# Patient Record
Sex: Male | Born: 1955 | Race: White | Hispanic: No | Marital: Married | Smoking: Never smoker
Health system: Southern US, Community
[De-identification: ages and names within clinical notes are randomized; demographics above are authoritative.]

## PROBLEM LIST (undated history)

## (undated) DIAGNOSIS — F329 Major depressive disorder, single episode, unspecified: Secondary | ICD-10-CM

## (undated) DIAGNOSIS — R51 Headache: Secondary | ICD-10-CM

## (undated) DIAGNOSIS — G8929 Other chronic pain: Secondary | ICD-10-CM

## (undated) DIAGNOSIS — R519 Headache, unspecified: Secondary | ICD-10-CM

## (undated) DIAGNOSIS — G709 Myoneural disorder, unspecified: Secondary | ICD-10-CM

## (undated) DIAGNOSIS — M1990 Unspecified osteoarthritis, unspecified site: Secondary | ICD-10-CM

## (undated) DIAGNOSIS — I82409 Acute embolism and thrombosis of unspecified deep veins of unspecified lower extremity: Secondary | ICD-10-CM

## (undated) DIAGNOSIS — K219 Gastro-esophageal reflux disease without esophagitis: Secondary | ICD-10-CM

## (undated) DIAGNOSIS — F32A Depression, unspecified: Secondary | ICD-10-CM

## (undated) HISTORY — PX: SHOULDER SURGERY: SHX246

## (undated) HISTORY — DX: Acute embolism and thrombosis of unspecified deep veins of unspecified lower extremity: I82.409

## (undated) HISTORY — DX: Depression, unspecified: F32.A

## (undated) HISTORY — DX: Major depressive disorder, single episode, unspecified: F32.9

## (undated) HISTORY — PX: ABDOMINAL SURGERY: SHX537

---

## 1991-02-24 HISTORY — PX: OTHER SURGICAL HISTORY: SHX169

## 1991-02-24 HISTORY — PX: RESECTION DISTAL CLAVICAL: SHX5053

## 1994-02-23 HISTORY — PX: ROTATOR CUFF REPAIR: SHX139

## 2001-02-23 DIAGNOSIS — I82409 Acute embolism and thrombosis of unspecified deep veins of unspecified lower extremity: Secondary | ICD-10-CM

## 2001-02-23 HISTORY — DX: Acute embolism and thrombosis of unspecified deep veins of unspecified lower extremity: I82.409

## 2014-06-08 ENCOUNTER — Emergency Department (HOSPITAL_COMMUNITY)
Admission: EM | Admit: 2014-06-08 | Discharge: 2014-06-09 | Disposition: A | Discharge status: Home / Self Care | Payer: Federal, State, Local not specified - PPO | Attending: Emergency Medicine | Admitting: Emergency Medicine

## 2014-06-08 ENCOUNTER — Encounter (HOSPITAL_COMMUNITY): Payer: Self-pay | Admitting: *Deleted

## 2014-06-08 DIAGNOSIS — F419 Anxiety disorder, unspecified: Secondary | ICD-10-CM | POA: Diagnosis not present

## 2014-06-08 DIAGNOSIS — F329 Major depressive disorder, single episode, unspecified: Secondary | ICD-10-CM | POA: Diagnosis not present

## 2014-06-08 DIAGNOSIS — G8929 Other chronic pain: Secondary | ICD-10-CM | POA: Diagnosis not present

## 2014-06-08 DIAGNOSIS — F1310 Sedative, hypnotic or anxiolytic abuse, uncomplicated: Secondary | ICD-10-CM | POA: Diagnosis not present

## 2014-06-08 DIAGNOSIS — F32A Depression, unspecified: Secondary | ICD-10-CM

## 2014-06-08 DIAGNOSIS — R630 Anorexia: Secondary | ICD-10-CM | POA: Diagnosis not present

## 2014-06-08 HISTORY — DX: Other chronic pain: G89.29

## 2014-06-08 LAB — COMPREHENSIVE METABOLIC PANEL
ALT: 30 U/L (ref 0–53)
AST: 20 U/L (ref 0–37)
Albumin: 4.4 g/dL (ref 3.5–5.2)
Alkaline Phosphatase: 62 U/L (ref 39–117)
Anion gap: 11 (ref 5–15)
BUN: 14 mg/dL (ref 6–23)
CO2: 24 mmol/L (ref 19–32)
Calcium: 9.6 mg/dL (ref 8.4–10.5)
Chloride: 104 mmol/L (ref 96–112)
Creatinine, Ser: 0.9 mg/dL (ref 0.50–1.35)
GFR calc Af Amer: >90 mL/min (ref >90)
GFR calc non Af Amer: >90 mL/min (ref >90)
Glucose, Bld: 105 mg/dL — ABNORMAL HIGH (ref 70–99)
Potassium: 3.8 mmol/L (ref 3.5–5.1)
Sodium: 139 mmol/L (ref 135–145)
Total Bilirubin: 0.5 mg/dL (ref 0.3–1.2)
Total Protein: 7.1 g/dL (ref 6.0–8.3)

## 2014-06-08 LAB — URINALYSIS, ROUTINE W REFLEX MICROSCOPIC
Bilirubin Urine: NEGATIVE
Glucose, UA: NEGATIVE mg/dL
Hgb urine dipstick: NEGATIVE
Ketones, ur: NEGATIVE mg/dL
Leukocytes, UA: NEGATIVE
Nitrite: NEGATIVE
Protein, ur: NEGATIVE mg/dL
Specific Gravity, Urine: 1.016 (ref 1.005–1.030)
Urobilinogen, UA: 0.2 mg/dL (ref 0.0–1.0)
pH: 8 (ref 5.0–8.0)

## 2014-06-08 LAB — CBC WITH DIFFERENTIAL/PLATELET
Basophils Absolute: 0.1 10*3/uL (ref 0.0–0.1)
Basophils Relative: 1 % (ref 0–1)
Eosinophils Absolute: 0.1 10*3/uL (ref 0.0–0.7)
Eosinophils Relative: 1 % (ref 0–5)
HCT: 46.9 % (ref 39.0–52.0)
Hemoglobin: 16.2 g/dL (ref 13.0–17.0)
Lymphocytes Relative: 20 % (ref 12–46)
Lymphs Abs: 1.3 10*3/uL (ref 0.7–4.0)
MCH: 31 pg (ref 26.0–34.0)
MCHC: 34.5 g/dL (ref 30.0–36.0)
MCV: 89.8 fL (ref 78.0–100.0)
Monocytes Absolute: 0.5 10*3/uL (ref 0.1–1.0)
Monocytes Relative: 7 % (ref 3–12)
Neutro Abs: 4.6 10*3/uL (ref 1.7–7.7)
Neutrophils Relative %: 71 % (ref 43–77)
Platelets: 168 10*3/uL (ref 150–400)
RBC: 5.22 MIL/uL (ref 4.22–5.81)
RDW: 13 % (ref 11.5–15.5)
WBC: 6.4 10*3/uL (ref 4.0–10.5)

## 2014-06-08 LAB — RAPID URINE DRUG SCREEN, HOSP PERFORMED
Amphetamines: NOT DETECTED
Barbiturates: NOT DETECTED
Benzodiazepines: POSITIVE — AB
Cocaine: NOT DETECTED
Opiates: NOT DETECTED
Tetrahydrocannabinol: NOT DETECTED

## 2014-06-08 LAB — ETHANOL: Alcohol, Ethyl (B): <5 mg/dL (ref 0–9)

## 2014-06-08 MED ORDER — HYDROMORPHONE HCL 1 MG/ML IJ SOLN
1.0000 mg | Freq: Once | INTRAMUSCULAR | Status: AC
  Administered 2014-06-08: 1 mg via INTRAVENOUS
  Filled 2014-06-08: qty 1

## 2014-06-08 MED ORDER — ONDANSETRON HCL 4 MG/2ML IJ SOLN
4.0000 mg | Freq: Once | INTRAMUSCULAR | Status: AC
  Administered 2014-06-08: 4 mg via INTRAVENOUS
  Filled 2014-06-08: qty 2

## 2014-06-08 NOTE — ED Provider Notes (Signed)
CSN: [id number]     Arrival date & time 06/08/14  1730 History   First MD Initiated Contact with Patient 06/08/14 1814     Chief Complaint  Patient presents with  . Anxiety  . Pain     (Consider location/radiation/quality/duration/timing/severity/associated sxs/prior Treatment) Patient is a 59 y.o. male presenting with mental health disorder. The history is provided by the patient. No language interpreter was used.  Mental Health Problem Presenting symptoms: depression and disorganized thought process   Patient accompanied by:  Spouse Degree of incapacity (severity):  Severe Onset quality:  Gradual Duration:  6 months Timing:  Constant Progression:  Worsening Chronicity:  New Context: medication, recent medication change and stressful life event   Relieved by:  Nothing Worsened by:  Family interactions Ineffective treatments:  Anti-anxiety medications Associated symptoms: anxiety, appetite change and distractible   Associated symptoms: no abdominal pain   Risk factors: family hx of mental illness   Pt's wife reports she feels like pt has had some type of breakdown.  She reports pt has had 25 pounds of weight loss in the past 6 months.  Pt is not eating.  Pt is not drinking.  Pt admits to depression.  Pt denies being suicidal.  (Pt reports he does not have the energy) Wife reports pt has problems he doesn't want to talk about.   Pt is on a duragesic patch.  Past Medical History  Diagnosis Date  . Chronic pain    History reviewed. No pertinent past surgical history. History reviewed. No pertinent family history. History  Substance Use Topics  . Smoking status: Not on file  . Smokeless tobacco: Not on file  . Alcohol Use: No    Review of Systems  Constitutional: Positive for appetite change.  Gastrointestinal: Negative for abdominal pain.  Psychiatric/Behavioral: The patient is nervous/anxious.   All other systems reviewed and are negative.     Allergies  Review of  patient's allergies indicates no known allergies.  Home Medications   Prior to Admission medications   Not on File   BP 132/83 mmHg  Pulse 61  Temp(Src) 98 F (36.7 C) (Oral)  Resp 16  Ht 6\' 1"  (1.854 m)  Wt 189 lb (85.73 kg)  BMI 24.94 kg/m2  SpO2 97% Physical Exam  Constitutional: He is oriented to person, place, and time. He appears well-developed and well-nourished.  HENT:  Head: Normocephalic and atraumatic.  Eyes: Conjunctivae are normal. Pupils are equal, round, and reactive to light.  Neck: Normal range of motion. Neck supple.  Cardiovascular: Normal rate and normal heart sounds.   Pulmonary/Chest: Effort normal.  Abdominal: Soft.  Musculoskeletal: Normal range of motion.  Neurological: He is alert and oriented to person, place, and time. He has normal reflexes.  Poor concentration,   Skin: Skin is warm.  Psychiatric: He has a normal mood and affect.  Nursing note and vitals reviewed.   ED Course  Procedures (including critical care time) Labs Review Labs Reviewed  COMPREHENSIVE METABOLIC PANEL - Abnormal; Notable for the following:    Glucose, Bld 105 (*)    All other components within normal limits  URINE RAPID DRUG SCREEN (HOSP PERFORMED) - Abnormal; Notable for the following:    Benzodiazepines POSITIVE (*)    All other components within normal limits  CBC WITH DIFFERENTIAL/PLATELET  URINALYSIS, ROUTINE W REFLEX MICROSCOPIC  ETHANOL    Imaging Review No results found.   EKG Interpretation None      MDM  TTS evaluated pt.  [facility] counselor informed me pt meets criteria for inpatient depression treatment.  Behavioral health reports they have a bed available   Final diagnoses:  Depression  Chronic pain    Transfer to behavioral health    Elson Areas, PA-C 06/08/14 2217  Linwood Dibbles, MD 06/08/14 2222

## 2014-06-08 NOTE — ED Notes (Signed)
Called [facility] for transport. 

## 2014-06-08 NOTE — ED Notes (Signed)
TTS set up at bedside. 

## 2014-06-08 NOTE — ED Notes (Signed)
Pt and wife are discussing their preferences for pt's treatment, [facility].

## 2014-06-08 NOTE — ED Notes (Signed)
PA at bedside discussing disposition.

## 2014-06-08 NOTE — ED Notes (Signed)
Pt reports having chronic pain to entire body. Reports unable to eat or drink. Family states that "family has had a breakdown." pt denies SI or HI but is feeling depressed due to not being able to find "good doctors" in [place], he recently moved there.
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
## 2014-06-08 NOTE — BH Assessment (Signed)
Received TTS consult request. Spoke to Campbell LernerLeslie Sophia, PA-C who said Pt has a history of depression and has been not eating, not drinking, losing weight and severely depressed. Pt is unfocused with tangential thought process. Tele-assessment will be initiated.  Harlin RainFord Ellis Ria CommentWarrick Jr, LPC, [facility] Triage Specialist [phone]

## 2014-06-09 ENCOUNTER — Inpatient Hospital Stay (HOSPITAL_COMMUNITY)
Admission: AD | Admit: 2014-06-09 | Discharge: 2014-06-13 | DRG: 885 | Disposition: A | Discharge status: Home / Self Care | Payer: Federal, State, Local not specified - PPO | Source: Intra-hospital | Attending: Psychiatry | Admitting: Psychiatry

## 2014-06-09 ENCOUNTER — Encounter (HOSPITAL_COMMUNITY): Payer: Self-pay | Admitting: *Deleted

## 2014-06-09 DIAGNOSIS — G4700 Insomnia, unspecified: Secondary | ICD-10-CM | POA: Diagnosis present

## 2014-06-09 DIAGNOSIS — F332 Major depressive disorder, recurrent severe without psychotic features: Secondary | ICD-10-CM | POA: Diagnosis present

## 2014-06-09 DIAGNOSIS — M1990 Unspecified osteoarthritis, unspecified site: Secondary | ICD-10-CM | POA: Diagnosis present

## 2014-06-09 DIAGNOSIS — G8929 Other chronic pain: Secondary | ICD-10-CM | POA: Diagnosis present

## 2014-06-09 DIAGNOSIS — K219 Gastro-esophageal reflux disease without esophagitis: Secondary | ICD-10-CM | POA: Diagnosis present

## 2014-06-09 DIAGNOSIS — R519 Headache, unspecified: Secondary | ICD-10-CM

## 2014-06-09 DIAGNOSIS — R51 Headache: Secondary | ICD-10-CM

## 2014-06-09 HISTORY — DX: Headache, unspecified: R51.9

## 2014-06-09 HISTORY — DX: Myoneural disorder, unspecified: G70.9

## 2014-06-09 HISTORY — DX: Headache: R51

## 2014-06-09 HISTORY — DX: Unspecified osteoarthritis, unspecified site: M19.90

## 2014-06-09 HISTORY — DX: Gastro-esophageal reflux disease without esophagitis: K21.9

## 2014-06-09 LAB — VALPROIC ACID LEVEL: Valproic Acid Lvl: 51.8 ug/mL (ref 50.0–100.0)

## 2014-06-09 MED ORDER — HYDROXYZINE HCL 25 MG PO TABS: 25.0000 mg | ORAL_TABLET | ORAL | Status: DC | PRN

## 2014-06-09 MED ORDER — DIVALPROEX SODIUM 500 MG PO DR TAB
500.0000 mg | DELAYED_RELEASE_TABLET | Freq: Two times a day (BID) | ORAL | Status: DC
  Administered 2014-06-09 – 2014-06-13 (×9): 500 mg via ORAL
  Filled 2014-06-09: qty 1
  Filled 2014-06-09: qty 6
  Filled 2014-06-09 (×2): qty 1
  Filled 2014-06-09 (×2): qty 6
  Filled 2014-06-09 (×6): qty 1
  Filled 2014-06-09: qty 6
  Filled 2014-06-09: qty 1

## 2014-06-09 MED ORDER — CHOLECALCIFEROL 10 MCG (400 UNIT) PO TABS
400.0000 [IU] | ORAL_TABLET | Freq: Every day | ORAL | Status: DC
  Administered 2014-06-09 – 2014-06-13 (×5): 400 [IU] via ORAL
  Filled 2014-06-09 (×8): qty 1

## 2014-06-09 MED ORDER — TOPIRAMATE 100 MG PO TABS
100.0000 mg | ORAL_TABLET | Freq: Two times a day (BID) | ORAL | Status: DC
  Administered 2014-06-09: 100 mg via ORAL
  Filled 2014-06-09 (×6): qty 1

## 2014-06-09 MED ORDER — PANTOPRAZOLE SODIUM 40 MG PO TBEC
40.0000 mg | DELAYED_RELEASE_TABLET | Freq: Two times a day (BID) | ORAL | Status: DC
  Administered 2014-06-09 – 2014-06-13 (×8): 40 mg via ORAL
  Filled 2014-06-09 (×4): qty 1
  Filled 2014-06-09: qty 6
  Filled 2014-06-09 (×2): qty 1
  Filled 2014-06-09: qty 6
  Filled 2014-06-09: qty 1
  Filled 2014-06-09: qty 6
  Filled 2014-06-09: qty 1
  Filled 2014-06-09: qty 6
  Filled 2014-06-09 (×2): qty 1

## 2014-06-09 MED ORDER — DULOXETINE HCL 30 MG PO CPEP
30.0000 mg | ORAL_CAPSULE | Freq: Every day | ORAL | Status: DC
  Administered 2014-06-09 – 2014-06-13 (×5): 30 mg via ORAL
  Filled 2014-06-09: qty 1
  Filled 2014-06-09: qty 3
  Filled 2014-06-09 (×6): qty 1
  Filled 2014-06-09: qty 3

## 2014-06-09 MED ORDER — ALUM & MAG HYDROXIDE-SIMETH 200-200-20 MG/5ML PO SUSP
30.0000 mL | ORAL | Status: DC | PRN
  Administered 2014-06-10 – 2014-06-13 (×9): 30 mL via ORAL
  Filled 2014-06-09 (×9): qty 30

## 2014-06-09 MED ORDER — HYDROXYZINE HCL 25 MG PO TABS
25.0000 mg | ORAL_TABLET | ORAL | Status: DC | PRN
  Administered 2014-06-09 – 2014-06-12 (×3): 25 mg via ORAL
  Filled 2014-06-09: qty 10
  Filled 2014-06-09 (×3): qty 1

## 2014-06-09 MED ORDER — ENSURE ENLIVE PO LIQD
237.0000 mL | Freq: Two times a day (BID) | ORAL | Status: DC
  Administered 2014-06-10 – 2014-06-11 (×3): 237 mL via ORAL

## 2014-06-09 MED ORDER — METHOCARBAMOL 500 MG PO TABS
500.0000 mg | ORAL_TABLET | Freq: Three times a day (TID) | ORAL | Status: DC | PRN
  Administered 2014-06-09 – 2014-06-10 (×2): 500 mg via ORAL
  Filled 2014-06-09 (×3): qty 1

## 2014-06-09 MED ORDER — SUMATRIPTAN SUCCINATE 50 MG PO TABS
100.0000 mg | ORAL_TABLET | Freq: Four times a day (QID) | ORAL | Status: DC | PRN
  Administered 2014-06-12 – 2014-06-13 (×2): 100 mg via ORAL
  Filled 2014-06-09 (×3): qty 2

## 2014-06-09 MED ORDER — TIZANIDINE HCL 4 MG PO TABS
4.0000 mg | ORAL_TABLET | Freq: Two times a day (BID) | ORAL | Status: DC
  Administered 2014-06-09 – 2014-06-13 (×9): 4 mg via ORAL
  Filled 2014-06-09: qty 1
  Filled 2014-06-09 (×2): qty 6
  Filled 2014-06-09 (×6): qty 1
  Filled 2014-06-09: qty 2
  Filled 2014-06-09: qty 1
  Filled 2014-06-09 (×2): qty 6
  Filled 2014-06-09: qty 1

## 2014-06-09 MED ORDER — MAGNESIUM HYDROXIDE 400 MG/5ML PO SUSP: 30.0000 mL | Freq: Every day | ORAL | Status: DC | PRN

## 2014-06-09 MED ORDER — ACETAMINOPHEN 325 MG PO TABS: 650.0000 mg | ORAL_TABLET | Freq: Four times a day (QID) | ORAL | Status: DC | PRN

## 2014-06-09 MED ORDER — TRAZODONE HCL 50 MG PO TABS
50.0000 mg | ORAL_TABLET | Freq: Every evening | ORAL | Status: DC | PRN
  Administered 2014-06-09 – 2014-06-10 (×2): 50 mg via ORAL
  Filled 2014-06-09 (×2): qty 1

## 2014-06-09 MED ORDER — GABAPENTIN 100 MG PO CAPS
100.0000 mg | ORAL_CAPSULE | Freq: Three times a day (TID) | ORAL | Status: DC
  Administered 2014-06-09 – 2014-06-10 (×3): 100 mg via ORAL
  Filled 2014-06-09 (×10): qty 1

## 2014-06-09 NOTE — [facility] Suicide Risk Assessment (Signed)
[facility] Admission Suicide Risk Assessment   Nursing information obtained from:    Demographic factors:    Current Mental Status:    Loss Factors:    Historical Factors:    Risk Reduction Factors:    Total Time spent with patient: 1 hour Principal Problem: Major depressive disorder, recurrent severe without psychotic features Diagnosis:   Patient Active Problem List   Diagnosis Date Noted  . Major depressive disorder, recurrent severe without psychotic features [F33.2] 06/09/2014     Continued Clinical Symptoms:  Alcohol Use Disorder Identification Test Final Score (AUDIT): 0 The "Alcohol Use Disorders Identification Test", Guidelines for Use in Primary Care, Second Edition.  World Science writer [facility]). Score between 0-7:  no or low risk or alcohol related problems. Score between 8-15:  moderate risk of alcohol related problems. Score between 16-19:  high risk of alcohol related problems. Score 20 or above:  warrants further diagnostic evaluation for alcohol dependence and treatment.   CLINICAL FACTORS:   Depression:   Hopelessness Insomnia Recent sense of peace/wellbeing Severe Chronic Pain Previous Psychiatric Diagnoses and Treatments Medical Diagnoses and Treatments/Surgeries   Musculoskeletal: Strength & Muscle Tone: within normal limits Gait & Station: normal Patient leans: Backward  Psychiatric Specialty Exam: Physical Exam  ROS  Blood pressure 147/89, pulse 107, temperature 98 F (36.7 C), temperature source Oral, resp. rate 16, height  (1.854 m), weight 85.276 kg (188 lb).Body mass index is 24.81 kg/(m^2).  General Appearance: Casual  Eye Contact::  Fair  Speech:  Slow  Volume:  Decreased  Mood:  Anxious, Depressed and Dysphoric  Affect:  Constricted and Depressed  Thought Process:  Circumstantial  Orientation:  Full (Time, Place, and Person)  Thought Content:  Rumination  Suicidal Thoughts:  No  Homicidal Thoughts:  No  Memory:  Immediate;    Poor Recent;   Poor Remote;   Fair  Judgement:  Intact  Insight:  Shallow  Psychomotor Activity:  Decreased  Concentration:  Poor  Recall:  Poor  Fund of Knowledge:Fair  Language: Fair  Akathisia:  No  Handed:  Right  AIMS (if indicated):     Assets:  Communication Skills Desire for Improvement Housing  Sleep:     Cognition: Impaired,  Mild  ADL's:  Impaired     COGNITIVE FEATURES THAT CONTRIBUTE TO RISK:  Polarized thinking and Thought constriction (tunnel vision)    SUICIDE RISK:   Mild:  Suicidal ideation of limited frequency, intensity, duration, and specificity.  There are no identifiable plans, no associated intent, mild dysphoria and related symptoms, good self-control (both objective and subjective assessment), few other risk factors, and identifiable protective factors, including available and accessible social support.  PLAN OF CARE: Patient is 59 year old Caucasian married man who is admitted because of severe depression with feelings of hopelessness worthlessness and anhedonia.  He is feeling overwhelmed and he is very concerned about his health issues.  Patient has multiple health issues.  He reported cannot function and having suicidal thoughts but no plan.  Patient requires inpatient psychiatric treatment and stabilization.  Please see history, physical and treatment plan for more details.    Medical Decision Making:  Review of Psycho-Social Stressors (1), Review or order clinical lab tests (1), Decision to obtain old records (1), Review and summation of old records (2), Established Problem, Worsening (2), New Problem, with no additional work-up planned (3), Review of Medication Regimen & Side Effects (2) and Review of New Medication or Change in Dosage (2)  I certify that  inpatient services furnished can reasonably be expected to improve the patient's condition.   Yaqueline Gutter T. 06/09/2014, 3:42 PM

## 2014-06-09 NOTE — [facility] Group Notes (Signed)
[facility] Group Notes: (Clinical Social Work)   06/09/2014      Type of Therapy:  Group Therapy   Participation Level:  Did Not Attend despite MHT prompting   Ambrose MantleMareida Grossman-Orr, LCSW 06/09/2014, 12:54 PM
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
## 2014-06-09 NOTE — Progress Notes (Signed)
D: Pt denies SI/HI/AV. Pt is pleasant and cooperative. Pt rates depression at a 8, anxiety at a 10, and Helplessness/hopelessness at a 8. Pt would like to go home and feel she only is anxious and depressed because he is in so much pain and was taken of his pain medication.  A: Pt was offered support and encouragement. Pt was given scheduled medications. Pt was encourage to attend groups. Q 15 minute checks were done for safety.  R:Pt does not attend groups but interacts well with peers and staff. Pt taking medication.Pt receptive to treatment and safety maintained on unit.

## 2014-06-09 NOTE — ED Notes (Signed)
[facility] [place] in [facility] bag, all valuables went home with pt wife.

## 2014-06-09 NOTE — [facility] Group Notes (Signed)
The focus of this group is to educate the patient on the purpose and policies of crisis stabilization and provide a format to answer questions about their admission.  The group details unit policies and expectations of patients while admitted.  Pt did not attend 0900 nursing group this morning.  

## 2014-06-09 NOTE — Tx Team (Signed)
Initial Interdisciplinary Treatment Plan   PATIENT STRESSORS: Health problems Medication change or noncompliance   PATIENT STRENGTHS: Ability for insight Average or above average intelligence Capable of independent living Communication skills General fund of knowledge Supportive family/friends   PROBLEM LIST: Problem List/Patient Goals Date to be addressed Date deferred Reason deferred Estimated date of resolution  "Id like to be able to sleep" 06/09/14     "I just don't wanna be in pain" 06/09/14           Depression 06/09/14     Increased risk for suicide 06/09/14                              DISCHARGE CRITERIA:  Ability to meet basic life and health needs Adequate post-discharge living arrangements Improved stabilization in mood, thinking, and/or behavior Medical problems require only outpatient monitoring Motivation to continue treatment in a less acute level of care Need for constant or close observation no longer present Safe-care adequate arrangements made Verbal commitment to aftercare and medication compliance  PRELIMINARY DISCHARGE PLAN: Attend aftercare/continuing care group Outpatient therapy Participate in family therapy Return to previous living arrangement  PATIENT/FAMIILY INVOLVEMENT: This treatment plan has been presented to and reviewed with the patient, Joe Jennings, and/or family member. The patient and family have been given the opportunity to ask questions and make suggestions.  Fransico MichaelBrooks, Kesha Hurrell Laverne 06/09/2014, 1:17 AM

## 2014-06-09 NOTE — Progress Notes (Signed)
Psychoeducational Group Note  Date:  06/09/2014 Time:  2357  Group Topic/Focus:  Wrap-Up Group:   The focus of this group is to help patients review their daily goal of treatment and discuss progress on daily workbooks.  Participation Level: Did Not Attend  Participation Quality:  Not Applicable  Affect:  Not Applicable  Cognitive:  Not Applicable  Insight:  Not Applicable  Engagement in Group: Not Applicable  Additional Comments:  The patient sat in group for a few minutes and then left the room.   Hazle CocaGOODMAN, Henrietta Cieslewicz S 06/09/2014, 11:57 PM

## 2014-06-10 DIAGNOSIS — F332 Major depressive disorder, recurrent severe without psychotic features: Principal | ICD-10-CM

## 2014-06-10 MED ORDER — GABAPENTIN 100 MG PO CAPS
200.0000 mg | ORAL_CAPSULE | Freq: Three times a day (TID) | ORAL | Status: DC
  Administered 2014-06-10 – 2014-06-11 (×3): 200 mg via ORAL
  Filled 2014-06-10 (×8): qty 2

## 2014-06-10 NOTE — [facility] Group Notes (Signed)
[facility] Group Notes: (Clinical Social Work)   06/10/2014      Type of Therapy:  Group Therapy   Participation Level:  Did Not Attend despite MHT prompting   Tarissa Kerin Grossman-Orr, LCSW 06/10/2014, 12:33 PM     

## 2014-06-10 NOTE — Progress Notes (Signed)
D: Pt's mood is depressed. Eye contact is fair. Pt states that he doesn't want to be here and would like to be discharged soon. Denies SI/HI/AVH.  A: Support given. Verbalization encouraged. Pt encouraged to come to staff for any concerns. Medications given as prescribed. R: Pt is receptive. No complaints of pain or discomfort at this time. Q15 min safety checks maintained. Pt remains safe on the unit. Will continue to monitor.

## 2014-06-10 NOTE — [facility] Group Notes (Signed)
[facility] Group Notes:  (Nursing/MHT/Case Management/Adjunct)  Date:  06/10/2014  Time:  0930  Type of Therapy:  Nurse Education  Participation Level:  Active  Participation Quality:  Appropriate  Affect:  Appropriate  Cognitive:  Appropriate  Insight:  Appropriate  Engagement in Group:  Engaged  Modes of Intervention:  Education  Summary of Progress/Problems:  Joe Jennings, Joe Jennings L 06/10/2014, 4:17 PM
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
## 2014-06-10 NOTE — Progress Notes (Signed)
[facility] MD Progress Note  06/10/2014 1:35 PM Joe Jennings  MRN:  [id number] Subjective:  "Just not sure why I'm here."   Objective:  Joe Jennings is an 41 y. has a history of depression and describes how he has decompensated since his family moved from [place] to [place], [place] six months ago. Pt states he feels he is "having a mental breakdown." Pt reports he has been unable to find a primary care physician or a psychiatrist.   Today, he was seen.  He expressed the desire to establish mental health care now that he lives in [place].  He was unsure of the groups or the setting is most appropriate for him.  He does realize he needs help as an outpatient basis.  He is very motivated.    Principal Problem: Major depressive disorder, recurrent severe without psychotic features Diagnosis:   Patient Active Problem List   Diagnosis Date Noted  . Major depressive disorder, recurrent severe without psychotic features [F33.2] 06/09/2014   Total Time spent with patient: 30 minutes   Past Medical History:  Past Medical History  Diagnosis Date  . Chronic pain   . GERD (gastroesophageal reflux disease)   . Headache   . Neuromuscular disorder   . Arthritis     Past Surgical History  Procedure Laterality Date  . Resection distal clavical  1993  . Acromial plasty Left 1993  . Rotator cuff repair Right 1996   Family History: History reviewed. No pertinent family history. Social History:  History  Alcohol Use No     History  Drug Use No    History   Social History  . Marital Status: Married    Spouse Name: N/A  . Number of Children: N/A  . Years of Education: N/A   Social History Main Topics  . Smoking status: Never Smoker   . Smokeless tobacco: Not on file  . Alcohol Use: No  . Drug Use: No  . Sexual Activity: Yes   Other Topics Concern  . None   Social History Narrative   Additional History:    Sleep: Fair  Appetite:  Fair   Assessment:   Musculoskeletal: Strength  & Muscle Tone: within normal limits Gait & Station: normal Patient leans: N/A   Psychiatric Specialty Exam: Physical Exam  Vitals reviewed. Psychiatric: His mood appears anxious.    Review of Systems  Psychiatric/Behavioral: Positive for depression. The patient is nervous/anxious.   All other systems reviewed and are negative.   Blood pressure 158/100, pulse 105, temperature 98.4 F (36.9 C), temperature source Oral, resp. rate 16, height 6' 1"  (1.854 m), weight 85.276 kg (188 lb).Body mass index is 24.81 kg/(m^2).   General Appearance: Casual  Eye Contact:: Fair  Speech: Slow  Volume: Decreased  Mood: Anxious, Depressed and Dysphoric  Affect: Constricted and Depressed  Thought Process: Circumstantial  Orientation: Full (Time, Place, and Person)  Thought Content: Rumination  Suicidal Thoughts: No  Homicidal Thoughts: No  Memory: Immediate; Poor Recent; Poor Remote; Fair  Judgement: Intact  Insight: Shallow  Psychomotor Activity: Decreased  Concentration: Poor  Recall: Poor  Fund of Knowledge:Fair  Language: Fair  Akathisia: No  Handed: Right  AIMS (if indicated):    Assets: Communication Skills Desire for Improvement Housing  Sleep:  4.75 hrs  Cognition: Impaired, Mild  ADL's: Impaired        Current Medications: Current Facility-Administered Medications  Medication Dose Route Frequency Provider Last Rate Last Dose  . acetaminophen (TYLENOL) tablet 650 mg  650 mg Oral Q6H PRN Harriet Butte, NP      . alum & mag hydroxide-simeth (MAALOX/MYLANTA) 200-200-20 MG/5ML suspension 30 mL  30 mL Oral Q4H PRN Harriet Butte, NP      . cholecalciferol (VITAMIN D) tablet 400 Units  400 Units Oral Daily Harriet Butte, NP   400 Units at 06/10/14 0811  . divalproex (DEPAKOTE) DR tablet 500 mg  500 mg Oral BID Harriet Butte, NP   500 mg at 06/10/14 0811  . DULoxetine (CYMBALTA) DR capsule 30 mg  30 mg Oral Daily  Kerrie Buffalo, NP   30 mg at 06/10/14 0811  . feeding supplement (ENSURE ENLIVE) (ENSURE ENLIVE) liquid 237 mL  237 mL Oral BID BM Myer Peer Cobos, MD   237 mL at 06/10/14 1159  . gabapentin (NEURONTIN) capsule 200 mg  200 mg Oral TID Kerrie Buffalo, NP      . hydrOXYzine (ATARAX/VISTARIL) tablet 25 mg  25 mg Oral Q4H PRN Harriet Butte, NP   25 mg at 06/09/14 2300  . magnesium hydroxide (MILK OF MAGNESIA) suspension 30 mL  30 mL Oral Daily PRN Harriet Butte, NP      . methocarbamol (ROBAXIN) tablet 500 mg  500 mg Oral Q8H PRN Kerrie Buffalo, NP   500 mg at 06/09/14 1634  . pantoprazole (PROTONIX) EC tablet 40 mg  40 mg Oral BID Kerrie Buffalo, NP   40 mg at 06/10/14 0811  . SUMAtriptan (IMITREX) tablet 100 mg  100 mg Oral Q6H PRN Harriet Butte, NP      . tiZANidine (ZANAFLEX) tablet 4 mg  4 mg Oral BID Harriet Butte, NP   4 mg at 06/10/14 0811  . traZODone (DESYREL) tablet 50 mg  50 mg Oral QHS PRN Harriet Butte, NP   50 mg at 06/09/14 2109    Lab Results:  Results for orders placed or performed during the hospital encounter of 06/08/14 (from the past 48 hour(s))  CBC with Differential/Platelet     Status: None   Collection Time: 06/08/14  6:45 PM  Result Value Ref Range   WBC 6.4 4.0 - 10.5 K/uL   RBC 5.22 4.22 - 5.81 MIL/uL   Hemoglobin 16.2 13.0 - 17.0 g/dL   HCT 46.9 39.0 - 52.0 %   MCV 89.8 78.0 - 100.0 fL   MCH 31.0 26.0 - 34.0 pg   MCHC 34.5 30.0 - 36.0 g/dL   RDW 13.0 11.5 - 15.5 %   Platelets 168 150 - 400 K/uL   Neutrophils Relative % 71 43 - 77 %   Neutro Abs 4.6 1.7 - 7.7 K/uL   Lymphocytes Relative 20 12 - 46 %   Lymphs Abs 1.3 0.7 - 4.0 K/uL   Monocytes Relative 7 3 - 12 %   Monocytes Absolute 0.5 0.1 - 1.0 K/uL   Eosinophils Relative 1 0 - 5 %   Eosinophils Absolute 0.1 0.0 - 0.7 K/uL   Basophils Relative 1 0 - 1 %   Basophils Absolute 0.1 0.0 - 0.1 K/uL  Comprehensive metabolic panel     Status: Abnormal   Collection Time: 06/08/14  6:45 PM  Result  Value Ref Range   Sodium 139 135 - 145 mmol/L   Potassium 3.8 3.5 - 5.1 mmol/L   Chloride 104 96 - 112 mmol/L   CO2 24 19 - 32 mmol/L   Glucose, Bld 105 (H) 70 - 99 mg/dL   BUN 14 6 -  23 mg/dL   Creatinine, Ser 0.90 0.50 - 1.35 mg/dL   Calcium 9.6 8.4 - 10.5 mg/dL   Total Protein 7.1 6.0 - 8.3 g/dL   Albumin 4.4 3.5 - 5.2 g/dL   AST 20 0 - 37 U/L   ALT 30 0 - 53 U/L   Alkaline Phosphatase 62 39 - 117 U/L   Total Bilirubin 0.5 0.3 - 1.2 mg/dL   GFR calc non Af Amer >90 >90 mL/min   GFR calc Af Amer >90 >90 mL/min    Comment: (NOTE) The eGFR has been calculated using the CKD EPI equation. This calculation has not been validated in all clinical situations. eGFR's persistently <90 mL/min signify possible Chronic Kidney Disease.    Anion gap 11 5 - 15  Ethanol     Status: None   Collection Time: 06/08/14  6:45 PM  Result Value Ref Range   Alcohol, Ethyl (B) <5 0 - 9 mg/dL    Comment:        LOWEST DETECTABLE LIMIT FOR SERUM ALCOHOL IS 11 mg/dL FOR MEDICAL PURPOSES ONLY   Urinalysis, Routine w reflex microscopic     Status: None   Collection Time: 06/08/14  7:05 PM  Result Value Ref Range   Color, Urine YELLOW YELLOW   APPearance CLEAR CLEAR   Specific Gravity, Urine 1.016 1.005 - 1.030   pH 8.0 5.0 - 8.0   Glucose, UA NEGATIVE NEGATIVE mg/dL   Hgb urine dipstick NEGATIVE NEGATIVE   Bilirubin Urine NEGATIVE NEGATIVE   Ketones, ur NEGATIVE NEGATIVE mg/dL   Protein, ur NEGATIVE NEGATIVE mg/dL   Urobilinogen, UA 0.2 0.0 - 1.0 mg/dL   Nitrite NEGATIVE NEGATIVE   Leukocytes, UA NEGATIVE NEGATIVE    Comment: MICROSCOPIC NOT DONE ON URINES WITH NEGATIVE PROTEIN, BLOOD, LEUKOCYTES, NITRITE, OR GLUCOSE <1000 mg/dL.  Drug screen panel, emergency     Status: Abnormal   Collection Time: 06/08/14  7:05 PM  Result Value Ref Range   Opiates NONE DETECTED NONE DETECTED   Cocaine NONE DETECTED NONE DETECTED   Benzodiazepines POSITIVE (A) NONE DETECTED   Amphetamines NONE DETECTED NONE  DETECTED   Tetrahydrocannabinol NONE DETECTED NONE DETECTED   Barbiturates NONE DETECTED NONE DETECTED    Comment:        DRUG SCREEN FOR MEDICAL PURPOSES ONLY.  IF CONFIRMATION IS NEEDED FOR ANY PURPOSE, NOTIFY LAB WITHIN 5 DAYS.        LOWEST DETECTABLE LIMITS FOR URINE DRUG SCREEN Drug Class       Cutoff (ng/mL) Amphetamine      1000 Barbiturate      200 Benzodiazepine   209 Tricyclics       470 Opiates          300 Cocaine          300 THC              50   Valproic acid level     Status: None   Collection Time: 06/08/14 11:00 PM  Result Value Ref Range   Valproic Acid Lvl 51.8 50.0 - 100.0 ug/mL    Physical Findings: AIMS: Facial and Oral Movements Muscles of Facial Expression: None, normal Lips and Perioral Area: None, normal Jaw: None, normal Tongue: None, normal,Extremity Movements Upper (arms, wrists, hands, fingers): None, normal Lower (legs, knees, ankles, toes): None, normal, Trunk Movements Neck, shoulders, hips: None, normal, Overall Severity Severity of abnormal movements (highest score from questions above): None, normal Incapacitation due to abnormal movements: None, normal  Patient's awareness of abnormal movements (rate only patient's report): No Awareness, Dental Status Current problems with teeth and/or dentures?: No Does patient usually wear dentures?: No  CIWA:  CIWA-Ar Total: 10 COWS:     Treatment Plan Summary: Admit for crisis management and mood stabilization. Medication management to re-stabilize current mood symptoms.  Increased Neurontin to 200 mg TID for pain sx.   Group counseling sessions for coping skills Medical consults as needed Review and reinstate any pertinent home medications for other health problems  Medical Decision Making:  Review of Psycho-Social Stressors (1), Discuss test with performing physician (1), Review and summation of old records (2) and Review of New Medication or Change in Dosage (2)  Sheila May [place]  AGNP-BC 06/10/2014, 1:35 PM  I agreed with the findings, treatment and disposition plan of this patient. Berniece Andreas, MD

## 2014-06-11 MED ORDER — FAMOTIDINE 20 MG PO TABS: 20.0000 mg | ORAL_TABLET | Freq: Two times a day (BID) | ORAL | Status: DC

## 2014-06-11 MED ORDER — SUCRALFATE 1 G PO TABS
1.0000 g | ORAL_TABLET | Freq: Three times a day (TID) | ORAL | Status: DC
  Administered 2014-06-11 – 2014-06-13 (×8): 1 g via ORAL
  Filled 2014-06-11: qty 12
  Filled 2014-06-11 (×2): qty 1
  Filled 2014-06-11: qty 12
  Filled 2014-06-11 (×3): qty 1
  Filled 2014-06-11 (×2): qty 12
  Filled 2014-06-11: qty 1
  Filled 2014-06-11 (×2): qty 12
  Filled 2014-06-11: qty 1
  Filled 2014-06-11: qty 12
  Filled 2014-06-11: qty 1
  Filled 2014-06-11: qty 12

## 2014-06-11 MED ORDER — TRAZODONE HCL 100 MG PO TABS
100.0000 mg | ORAL_TABLET | Freq: Every day | ORAL | Status: DC
  Administered 2014-06-11: 100 mg via ORAL
  Filled 2014-06-11: qty 1

## 2014-06-11 MED ORDER — ENSURE ENLIVE PO LIQD
237.0000 mL | Freq: Three times a day (TID) | ORAL | Status: DC
  Administered 2014-06-11 – 2014-06-13 (×5): 237 mL via ORAL

## 2014-06-11 MED ORDER — TOPIRAMATE 25 MG PO TABS
50.0000 mg | ORAL_TABLET | Freq: Two times a day (BID) | ORAL | Status: DC
  Administered 2014-06-11 – 2014-06-13 (×4): 50 mg via ORAL
  Filled 2014-06-11: qty 2
  Filled 2014-06-11: qty 6
  Filled 2014-06-11: qty 2
  Filled 2014-06-11 (×2): qty 6
  Filled 2014-06-11 (×2): qty 2
  Filled 2014-06-11: qty 6
  Filled 2014-06-11: qty 2

## 2014-06-11 MED ORDER — TRAZODONE HCL 100 MG PO TABS
ORAL_TABLET | ORAL | Status: AC
  Filled 2014-06-11: qty 1

## 2014-06-11 MED ORDER — TRAZODONE HCL 100 MG PO TABS
100.0000 mg | ORAL_TABLET | Freq: Once | ORAL | Status: AC
  Administered 2014-06-11: 100 mg via ORAL
  Filled 2014-06-11 (×2): qty 1

## 2014-06-11 NOTE — Progress Notes (Signed)
D: Patient denies SI/HI and A/V hallucinations; patient reports sleep is poor; reports appetite is poor; reports energy level is low ; rates depression as 4/10; rates anxiety as 4/10; [place]; patient is   A: Monitored q 15 minutes; patient encouraged to attend groups; patient educated about medications; patient given medications per physician orders; patient encouraged to express feelings and/or concerns  R: Patient has had complaints about pain and that he needs to go; patient refused the prn pain medications available; Clinical research associatewriter had spoken with patient and spouse several times and made them aware; patient will still like to go home; patient has been moved to a medical bed and given a room alone due to terrible migraines; patient also needs darkness; patient appetite had been poor and has been drinking the nutrition supplements and was able to eat a [place] sandwich this evening; patient's interaction with staff and peers is appropriate but patient is irritable and reports its due to the pain; patient was able to set goal to talk with staff 1:1 when having feelings of SI; patient is taking medications as prescribed and tolerating medications

## 2014-06-11 NOTE — Progress Notes (Signed)
Psychoeducational Group Note  Date:  06/11/2014 Time:  0022  Group Topic/Focus:  Wrap-Up Group:   The focus of this group is to help patients review their daily goal of treatment and discuss progress on daily workbooks.  Participation Level: Did Not Attend  Participation Quality:  Not Applicable  Affect:  Not Applicable  Cognitive:  Not Applicable  Insight:  Not Applicable  Engagement in Group: Not Applicable  Additional Comments:  The patient did not attend group this evening.   Shelbey Spindler S 06/11/2014, 12:22 AM

## 2014-06-11 NOTE — [facility] Group Notes (Signed)
[facility] LCSW Group Therapy  Feelings Around Diagnosis 1:15 - 2:30 PM  06/11/2014 2:17 PM  Type of Therapy:  Group Therapy  Participation Level:  Did Not Attend  [name], Taleia Sadowski Hairston 06/11/2014, 2:17 PM
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
## 2014-06-11 NOTE — Progress Notes (Signed)
Adult Psychoeducational Group Note  Date:  06/11/2014 Time:  9:04 PM  Group Topic/Focus:  Wrap-Up Group:   The focus of this group is to help patients review their daily goal of treatment and discuss progress on daily workbooks.  Participation Level:  Did Not Attend  Additional Comments:  Pt was invited to come to group and stated "Not me."  Caswell CorwinOwen, Calob Baskette C 06/11/2014, 9:04 PM
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
## 2014-06-11 NOTE — Progress Notes (Signed)
Recreation Therapy Notes  Date: 04.18.2016 Time: 9:30am Location: [address] Hall Group Room   Group Topic: Stress Management  Goal Area(s) Addresses:  Patient will actively participate in stress management techniques presented during session.   Behavioral Response: Did not attend.   Marykay Lexenise L Kalie Cabral, LRT/CTRS  Jearl KlinefelterBlanchfield, Raneshia Derick L 06/11/2014 3:27 PM

## 2014-06-12 MED ORDER — PANTOPRAZOLE SODIUM 40 MG PO TBEC: 40.0000 mg | DELAYED_RELEASE_TABLET | Freq: Two times a day (BID) | ORAL | Status: DC

## 2014-06-12 MED ORDER — DULOXETINE HCL 30 MG PO CPEP: 30.0000 mg | ORAL_CAPSULE | Freq: Every day | ORAL | Status: DC

## 2014-06-12 MED ORDER — SUCRALFATE 1 G PO TABS: 1.0000 g | ORAL_TABLET | Freq: Three times a day (TID) | ORAL | Status: DC

## 2014-06-12 MED ORDER — TRAZODONE HCL 150 MG PO TABS: 150.0000 mg | ORAL_TABLET | Freq: Every day | ORAL | Status: DC

## 2014-06-12 MED ORDER — TRAZODONE HCL 100 MG PO TABS: 100.0000 mg | ORAL_TABLET | Freq: Every day | ORAL | Status: DC

## 2014-06-12 MED ORDER — TIZANIDINE HCL 4 MG PO TABS: 4.0000 mg | ORAL_TABLET | Freq: Two times a day (BID) | ORAL | Status: DC

## 2014-06-12 MED ORDER — HYDROXYZINE HCL 25 MG PO TABS: 25.0000 mg | ORAL_TABLET | ORAL | Status: DC | PRN

## 2014-06-12 MED ORDER — SUMATRIPTAN SUCCINATE 100 MG PO TABS: 100.0000 mg | ORAL_TABLET | Freq: Four times a day (QID) | ORAL | Status: DC | PRN

## 2014-06-12 MED ORDER — TOPIRAMATE 50 MG PO TABS: 50.0000 mg | ORAL_TABLET | Freq: Two times a day (BID) | ORAL | Status: DC

## 2014-06-12 MED ORDER — VITAMIN D 400 UNITS PO TABS: 400.0000 [IU] | ORAL_TABLET | Freq: Every day | ORAL | Status: DC

## 2014-06-12 MED ORDER — FENTANYL 25 MCG/HR TD PT72
25.0000 ug | MEDICATED_PATCH | TRANSDERMAL | Status: DC
  Administered 2014-06-12: 25 ug via TRANSDERMAL
  Filled 2014-06-12: qty 1

## 2014-06-12 MED ORDER — TRAZODONE HCL 150 MG PO TABS
150.0000 mg | ORAL_TABLET | Freq: Every day | ORAL | Status: DC
  Administered 2014-06-12: 150 mg via ORAL
  Filled 2014-06-12 (×3): qty 3
  Filled 2014-06-12: qty 1

## 2014-06-12 MED ORDER — DIVALPROEX SODIUM 500 MG PO DR TAB: 500.0000 mg | DELAYED_RELEASE_TABLET | Freq: Two times a day (BID) | ORAL | Status: DC

## 2014-06-12 MED ORDER — MULTI-VITAMIN/MINERALS PO TABS: 1.0000 | ORAL_TABLET | Freq: Every day | ORAL | Status: DC

## 2014-06-12 NOTE — Progress Notes (Signed)
Patient ID: Joe Jennings, male   DOB: 07-Aug-1955, 59 y.o.   MRN: [id number] [facility] MD Progress Note  06/12/2014 10:58 AM Joe Jennings  MRN:  [id number] Subjective:    Patient remains somatically focused and reports chronic headaches, knee pain, and reflux symptoms. He also complains of significant insomnia. He denies side effects from medications . He complains of the bed being very uncomfortable, contributing to his insomnia and pain.  Objective: I have discussed case with treatment team and have met with patient. With patient's express consent and in his presence have also had phone converstation/family meeting with his wife Joe Jennings). Patient is wanting discharge today, but wife asked him to remain in hospital at least one more day, in order to continue approaching/working on insomnia, irritability, depression, and she reiterated her concern that he needs " more work up to know what is going on that is causing his pain". As noted, it has been confirmed that patient is on Fentanyl patch 25 mcg/72 hours, and that his last patch has been on for 6 days or so already. Patient is not presenting with any current significant withdrawal, but does report increased pain , discomfort, and some loose stools. Patient and wife agree that the most important issue for them is to establish PCP and Pain Clinic locally , as patient feels he has not been able to consolidate a good treatment team since he moved from [place] a few months ago. Tolerating medications wel. States he has been on Depakote for " mood", but is not endorsing history of mania, Cymbalta well tolerated thus far. Patient had been on Topamax 100 mgrs BID for migraine prophylaxis but states it was not working and wants it tapered . Trazodone only partially helpful for sleep. No disruptive behaviors on unit, tends to isolate. Of note, he has a recent Head MRI without contrast dated 05/03/14, reported as subcutaneous lesion on vertex with benign  characteristics, slight amount of periventricular white matter disease- non specific for age group, lobular mucoperiostial thickening in maxillary sinuses .  Principal Problem: Major depressive disorder, recurrent severe without psychotic features Diagnosis:   Patient Active Problem List   Diagnosis Date Noted  . Major depressive disorder, recurrent severe without psychotic features [F33.2] 06/09/2014   Total Time spent with patient: 30 minutes   Past Medical History:  Past Medical History  Diagnosis Date  . Chronic pain   . GERD (gastroesophageal reflux disease)   . Headache   . Neuromuscular disorder   . Arthritis     Past Surgical History  Procedure Laterality Date  . Resection distal clavical  1993  . Acromial plasty Left 1993  . Rotator cuff repair Right 1996   Family History: History reviewed. No pertinent family history. Social History:  History  Alcohol Use No     History  Drug Use No    History   Social History  . Marital Status: Married    Spouse Name: N/A  . Number of Children: N/A  . Years of Education: N/A   Social History Main Topics  . Smoking status: Never Smoker   . Smokeless tobacco: Not on file  . Alcohol Use: No  . Drug Use: No  . Sexual Activity: Yes   Other Topics Concern  . None   Social History Narrative   Additional History:    Sleep: Fair  Appetite:  Fair   Assessment:   Musculoskeletal: Strength & Muscle Tone: within normal limits Gait & Station: normal Patient leans: N/A  Psychiatric Specialty Exam: Physical Exam  Vitals reviewed. Psychiatric: His mood appears anxious.    Review of Systems  Constitutional: Negative for fever and chills.  Respiratory: Negative for cough and shortness of breath.   Cardiovascular: Negative for chest pain.  Gastrointestinal: Positive for heartburn.  Musculoskeletal: Negative.        Chronic knee pain   Skin: Negative for rash.  Neurological: Positive for headaches.   Psychiatric/Behavioral: Positive for depression. The patient has insomnia.     Blood pressure 126/85, pulse 106, temperature 98.3 F (36.8 C), temperature source Oral, resp. rate 18, height 6' 1"  (1.854 m), weight 188 lb (85.276 kg).Body mass index is 24.81 kg/(m^2).   General Appearance:  Fairly groomed   Engineer, water:: improved   Speech:normal   Mood:  Dysphoric   Affect:congruent, anxious   Thought Process:  Linear   Orientation: Full (Time, Place, and Person)  Thought Content:  Denies hallucinations, no delusions, focused on pain management issues   Suicidal Thoughts: No- at this time denies plan or intention of hurting self   Homicidal Thoughts: No  Memory: recent and remote grossly intact   Judgement: fair   Insight:  Fair   Psychomotor Activity: normal  Concentration: Poor  Recall: Poor  Fund of Knowledge:Fair  Language: Fair  Akathisia: No  Handed: Right  AIMS (if indicated):    Assets: Communication Skills Desire for Improvement Housing  Sleep:  4.75 hrs  Cognition: Impaired, Mild  ADL's: Impaired        Current Medications: Current Facility-Administered Medications  Medication Dose Route Frequency Provider Last Rate Last Dose  . acetaminophen (TYLENOL) tablet 650 mg  650 mg Oral Q6H PRN Harriet Butte, NP      . alum & mag hydroxide-simeth (MAALOX/MYLANTA) 200-200-20 MG/5ML suspension 30 mL  30 mL Oral Q4H PRN Harriet Butte, NP   30 mL at 06/11/14 2337  . cholecalciferol (VITAMIN D) tablet 400 Units  400 Units Oral Daily Harriet Butte, NP   400 Units at 06/12/14 0758  . divalproex (DEPAKOTE) DR tablet 500 mg  500 mg Oral BID Harriet Butte, NP   500 mg at 06/12/14 0759  . DULoxetine (CYMBALTA) DR capsule 30 mg  30 mg Oral Daily Kerrie Buffalo, NP   30 mg at 06/12/14 0759  . feeding supplement (ENSURE ENLIVE) (ENSURE ENLIVE) liquid 237 mL  237 mL Oral TID BM Clayton Bibles, RD   237 mL at 06/12/14 1005  . fentaNYL  (DURAGESIC - dosed mcg/hr) patch 25 mcg  25 mcg Transdermal Q72H Myer Peer Markese Bloxham, MD      . hydrOXYzine (ATARAX/VISTARIL) tablet 25 mg  25 mg Oral Q4H PRN Harriet Butte, NP   25 mg at 06/12/14 0241  . magnesium hydroxide (MILK OF MAGNESIA) suspension 30 mL  30 mL Oral Daily PRN Harriet Butte, NP      . methocarbamol (ROBAXIN) tablet 500 mg  500 mg Oral Q8H PRN Kerrie Buffalo, NP   500 mg at 06/10/14 2126  . pantoprazole (PROTONIX) EC tablet 40 mg  40 mg Oral BID Kerrie Buffalo, NP   40 mg at 06/12/14 0758  . sucralfate (CARAFATE) tablet 1 g  1 g Oral TID WC & HS Laverle Hobby, PA-C   1 g at 06/12/14 0759  . SUMAtriptan (IMITREX) tablet 100 mg  100 mg Oral Q6H PRN Harriet Butte, NP      . tiZANidine (ZANAFLEX) tablet 4 mg  4 mg Oral BID  Harriet Butte, NP   4 mg at 06/12/14 0759  . topiramate (TOPAMAX) tablet 50 mg  50 mg Oral BID Jenne Campus, MD   50 mg at 06/12/14 0759  . traZODone (DESYREL) tablet 150 mg  150 mg Oral QHS Jenne Campus, MD        Lab Results:  No results found for this or any previous visit (from the past 48 hour(s)).  Physical Findings: AIMS: Facial and Oral Movements Muscles of Facial Expression: None, normal Lips and Perioral Area: None, normal Jaw: None, normal Tongue: None, normal,Extremity Movements Upper (arms, wrists, hands, fingers): None, normal Lower (legs, knees, ankles, toes): None, normal, Trunk Movements Neck, shoulders, hips: None, normal, Overall Severity Severity of abnormal movements (highest score from questions above): None, normal Incapacitation due to abnormal movements: None, normal Patient's awareness of abnormal movements (rate only patient's report): No Awareness, Dental Status Current problems with teeth and/or dentures?: No Does patient usually wear dentures?: No  CIWA:  CIWA-Ar Total: 10 COWS:      Assessment: patient remains  Dysphoric , depressed, anxious, somatically focused, complains of insomnia. Not suicidal .  Patient initially requested discharge, and had put in a letter requesting discharge, but decided to rescind and stay in hospital at wife's encouragement.   Treatment Plan Summary: Continue inpatient treatment. Continue Cymbalta 30 mgrs QDAY  Continue Depakote ER 500 mgrs BID Continue Neurontin- 200 mgrs TID Restart Fentanyl Patch 25 mcgr Q 72 hours  Increase Trazodone to 150 mgrs QHS for insomnia Continue Protonix and Carafate for GI symptoms Will help patient establish contact with local pain clinic for ongoing management for ongoing management after discharge , and will refer to establish PCP follow up.  Medical Decision Making:  Established Problem, Stable/Improving (1), Review of Psycho-Social Stressors (1), Review or order clinical lab tests (1) and Review of Medication Regimen & Side Effects (2)  Bee Hammerschmidt  06/12/2014, 10:58 AM

## 2014-06-12 NOTE — Tx Team (Signed)
Interdisciplinary Treatment Plan Update   Date Reviewed:  06/12/2014  Time Reviewed:  3:04 PM  Progress in Treatment:   Attending groups: Yes, patient is attending groups. Participating in groups: Yes, engages in group discussion. Taking medication as prescribed: Yes  Tolerating medication: Yes Family/Significant other contact made:  Yes, collateral contact with wife. Patient understands diagnosis: Yes, patient understands diagnosis and need for treatment. Discussing patient identified problems/goals with staff: Yes, patient is able to express goals for treatment and discharge. Medical problems stabilized or resolved: Yes Denies suicidal/homicidal ideation: Yes Patient has not harmed self or others: Yes  For review of initial/current patient goals, please see plan of care.  Estimated Length of Stay:  Discharge today  Reasons for Continued Hospitalization:   New Problems/Goals identified:    Discharge Plan or Barriers:   [facility]  Additional Comments:  Patient and CSW reviewed patient's identified goals and treatment plan.  Patient verbalized understanding and agreed to treatment plan.   Attendees:  Patient:  06/12/2014 3:04 PM   Signature:  Sallyanne HaversF. Cobos, MD 06/12/2014 3:04 PM  Signature: Geoffery LyonsIrving Lugo, MD 06/12/2014 3:04 PM  Signature: Michaelle BirksBritney Guthrie,  RN 06/12/2014 3:04 PM  Signature:  Joslyn Devonaroline Beaudry, , RN 06/12/2014 3:04 PM  Signature:   06/12/2014 3:04 PM  Signature:  Juline PatchQuylle Juron Vorhees, LCSW 06/12/2014 3:04 PM  Signature:  Belenda CruiseKristin Drinkard, LCSW-A 06/12/2014 3:04 PM  Signature:  Leisa LenzValerie Enoch, Care Coordinator [facility] 06/12/2014 3:04 PM  Signature:   06/12/2014 3:04 PM  Signature:  06/12/2014  3:04 PM  Signature:   Onnie BoerJennifer Clark, RN URCM 06/12/2014  3:04 PM  Signature:   06/12/2014  3:04 PM    Scribe for Treatment Team:   Juline PatchQuylle Rachella Basden,  06/12/2014 3:04 PM

## 2014-06-12 NOTE — Progress Notes (Signed)
D   Pt complains of severe heartburn and pain  He continues to ask for his pain patches he said was supposed to be changed on Saturday   He said his wife brought them and he wants one  It was explained to pt that staff cannot give medication if doctor didn't order same and reassured him the doctor knows he is on the patch   He continues to grimace and complain A   Reassured pt of staff concern and offered other medications to help with his pain   Told pa of heartburn issue and new orders were received   Told pt of some known side effects of opiate pain medications regarding increasing depression    Administered medications and continue to monitor for effectiveness   Q 15 min checks   Encouraged pt to elevate his head and informed him of certain drinks and foods that worsen heartburn  R   Pt remains safe but was somewhat resistant to taking some of the medications ordered for pain  But did take his other medications   He verbalized understanding

## 2014-06-12 NOTE — Progress Notes (Signed)
D  Pt reports feeling much better now that he has his pain patch back on   He continues to have heartburn and takes maalox for same   He said the doctor plans to discharge him tomorrow and he said he is ready A   Verbal support given   Medications administered and effectiveness monitored   q 15 min checks R   Pt is safe at present

## 2014-06-12 NOTE — Clinical Social Work Note (Signed)
CSW spoke with patient's wife who advised patient was not having SI at time of admission. She share she is concerned that patient has serious medical problems and may need an endoscopy.  CSW advised wife to speak with patient's RN regarding concern.  She stated she plans to take patient to the ED upon picking him up for discharge.  CSW spoke with Liborio NixonPatrice White, RN who stated patient's RN was with another patient. Ms. Cliffton AstersWhite to have patient's RN call wife back regarding her concerns.

## 2014-06-12 NOTE — Progress Notes (Signed)
D: Patient is alert and oriented. Pt's mood and affect is anxious, depressed, and angry at times. Pt denies SI/HI and AVH. [facility] Pt is experiencing HTN today, pt denies symptoms (See docflowsheet-vitals). Pt rates depression and anxiety 4/10. Per RN Rodell PernaPatrice, pt's wife is requesting to speak with Clinical research associatewriter d/t wife stating pt needs emergency care, pt observed to be in no emergent distress; pt later returned to RN and denies that RN should contact wife after he gave her an update. Pt reports little pain relief from medications today including scheduled doses and PRN doses, however pt appears to be tolerating pain level. A: Pt made aware that wife would like RN to call her, pt encouraged to contact wife and update her about his care and inform writer if she would still like to speak with RN. MD Cobos made aware of pt's BP, will monitor and reassess frequently. Encouragement/Support provided to pt. Active listening by RN. Medication education reviewed with pt. PRN medication administered for indigestion and migraine per providers orders (See MAR). Scheduled medications administered per providers orders (See MAR). 15 minute checks continued per protocol for patient safety.  R: Patient cooperative and receptive to nursing interventions. Pt remains safe.

## 2014-06-12 NOTE — Progress Notes (Deleted)
  [facility] :  Late Entry for Unexpected Discharge on June 11, 2014  Will you be returning to the same living situation after discharge:  Yes,  Patient will be returning to his  home. At discharge, do you have transportation home?: Yes,  Wife transportedf patient home. Do you have the ability to pay for your medications: Yes,  Patient is able to obtain medications.  Release of information consent forms completed and in the chart;  Patient's signature needed at discharge.  Patient to Follow up at: Follow-up Information    Follow up with Florencia ReasonsPeg[facility] On 06/27/2014.   Why:  You are scheduled with Florencia ReasonsPeggy Bynum on Wednesday, Jun 27, 2014 at 8 AM   Contact information:   [address] [place], [place]   [address]  [phone]      Follow up with Dr. Tenny Crawoss - [facility] On 07/09/2014.   Why:  You are scheduled with Dr. Tenny Crawoss on Monday, Jul 09, 2014 at [facility]   Contact information:   [address] [place], [place]   [address]  [phone]      Patient denies SI/HI: Patient no longer endorsing SI/HI or other thoughts of self harm.   Safety Planning and Suicide Prevention discussed: .Reviewed with all patients during discharge planning group   Have you used any form of tobacco in the last 30 days? (Cigarettes, Smokeless Tobacco, Cigars, and/or Pipes): No  Has patient been referred to the Quitline?: No referral needed to quitline.   [name], Quanisha Drewry Hairston 06/12/2014, 8:49 AM

## 2014-06-12 NOTE — [facility] Suicide Risk Assessment (Signed)
[facility] INPATIENT:  Family/Significant Other Suicide Prevention Education  Suicide Prevention Education:  Education Completed; Joe OchoaDebra Jennings, Wife, [phone]; has been identified by the patient as the family member/significant other with whom the patient will be residing, and identified as the person(s) who will aid the patient in the event of a mental health crisis (suicidal ideations/suicide attempt).  With written consent from the patient, the family member/significant other has been provided the following suicide prevention education, prior to the and/or following the discharge of the patient.  Patient was not suicidal at time of admission nor during hospitalization.  The suicide prevention education provided includes the following:  Suicide risk factors  Suicide prevention and interventions  National Suicide Hotline telephone number  [facility] assessment telephone number  [facility] Emergency Assistance 911  [facility] and/or [facility] Crisis [facility] telephone number  Request made of family/significant other to:  Remove weapons (e.g., guns, rifles, knives), all items previously/currently identified as safety concern.      Remove drugs/medications (over-the-counter, prescriptions, illicit drugs), all items previously/currently identified as a safety concern.  The family member/significant other verbalizes understanding of the suicide prevention education information provided.  The family member/significant other agrees to remove the items of safety concern listed above.  Joe Jennings, Joe Jennings 06/12/2014, 3:08 PM

## 2014-06-12 NOTE — Progress Notes (Signed)
  [facility]  Will you be returning to the same living situation after discharge:  Yes,  Patient is returning home with wife. At discharge, do you have transportation home?: Yes,  Wife will transport patient home. Do you have the ability to pay for your medications: Yes,  Patient has the ability to obtain medications.  Release of information consent forms completed and in the chart;  Patient's signature needed at discharge.  Patient to Follow up at: Follow-up Information    Follow up with Florencia ReasonsP[facility]gy [facility]yn[facility] On 06/27/2014.   Why:  You are scheduled with Florencia ReasonsPeggy Bynum on Wednesday, Jun 27, 2014 at 8 AM   Contact information:   [address] [place], [place]   [address]  [phone]      Follow up with Dr. Tenny Crawoss - [facility] On 07/09/2014.   Why:  You are scheduled with Dr. Tenny Crawoss on Monday, Jul 09, 2014 at [facility] W8AM   Contact information:   [address] [place], [place]   [address]  [phone]      Patient denies SI/HI: Patient no longer endorsing SI/HI or other thoughts of self harm.   Safety Planning and Suicide Prevention discussed: .Reviewed with all patients during discharge planning group   Have you used any form of tobacco in the last 30 days? (Cigarettes, Smokeless Tobacco, Cigars, and/or Pipes): No  Has patient been referred to the Quitline?  No referral needed to quitline.   Joe Jennings, Joe Jennings 06/12/2014, 11:14 AM

## 2014-06-12 NOTE — [facility] Group Notes (Signed)
[facility] Group Notes:  (Nursing/MHT/Case Management/Adjunct)  Date:  06/12/2014  Time:  0900am  Type of Therapy:  Nurse Education  Participation Level:  Did Not Attend  Participation Quality:  Did not attend  Affect:  Did not attend  Cognitive:  Did not attend  Insight:  None  Engagement in Group:  Did not attend  Modes of Intervention:  Discussion, Education and Support  Summary of Progress/Problems: Pt was invited to group. Pt did not attend group.  Lendell CapriceGuthrie, Jakobi Thetford A 06/12/2014, 10:41 AM

## 2014-06-12 NOTE — Plan of Care (Signed)
Problem: Diagnosis: Increased Risk For Suicide Attempt Goal: STG-Patient Will Attend All Groups On The Unit Outcome: Progressing Patient is attending unit groups today. Goal: STG-Patient Will Comply With Medication Regime Outcome: Progressing Patient has adhered to medication regimen today.  Problem: Ineffective individual coping Goal: STG: Patient will remain free from self harm Outcome: Progressing Patient remains free from self harm. 15 minute checks continued per protocol for patient safety.

## 2014-06-13 ENCOUNTER — Encounter (HOSPITAL_COMMUNITY): Payer: Self-pay | Admitting: Registered Nurse

## 2014-06-13 MED ORDER — TRAZODONE HCL 150 MG PO TABS: 150.0000 mg | ORAL_TABLET | Freq: Every day | ORAL | Status: DC

## 2014-06-13 MED ORDER — SUMATRIPTAN SUCCINATE 100 MG PO TABS: 100.0000 mg | ORAL_TABLET | Freq: Four times a day (QID) | ORAL | Status: DC | PRN

## 2014-06-13 MED ORDER — SUCRALFATE 1 G PO TABS: 1.0000 g | ORAL_TABLET | Freq: Three times a day (TID) | ORAL | Status: DC

## 2014-06-13 MED ORDER — MULTI-VITAMIN/MINERALS PO TABS: 1.0000 | ORAL_TABLET | Freq: Every day | ORAL | Status: AC

## 2014-06-13 MED ORDER — TOPIRAMATE 50 MG PO TABS: 50.0000 mg | ORAL_TABLET | Freq: Two times a day (BID) | ORAL | Status: DC

## 2014-06-13 MED ORDER — PANTOPRAZOLE SODIUM 40 MG PO TBEC: 40.0000 mg | DELAYED_RELEASE_TABLET | Freq: Two times a day (BID) | ORAL | Status: DC

## 2014-06-13 MED ORDER — DIVALPROEX SODIUM 500 MG PO DR TAB: 500.0000 mg | DELAYED_RELEASE_TABLET | Freq: Two times a day (BID) | ORAL | Status: DC

## 2014-06-13 MED ORDER — AMOXICILLIN 500 MG PO CAPS
1000.0000 mg | ORAL_CAPSULE | Freq: Three times a day (TID) | ORAL | Status: DC
  Filled 2014-06-13 (×2): qty 30
  Filled 2014-06-13 (×2): qty 2
  Filled 2014-06-13: qty 30
  Filled 2014-06-13: qty 2

## 2014-06-13 MED ORDER — TIZANIDINE HCL 4 MG PO TABS: 4.0000 mg | ORAL_TABLET | Freq: Two times a day (BID) | ORAL | Status: DC

## 2014-06-13 MED ORDER — DULOXETINE HCL 30 MG PO CPEP: 30.0000 mg | ORAL_CAPSULE | Freq: Every day | ORAL | Status: DC

## 2014-06-13 MED ORDER — AMOXICILLIN 500 MG PO CAPS: 1000.0000 mg | ORAL_CAPSULE | Freq: Three times a day (TID) | ORAL | Status: DC

## 2014-06-13 NOTE — Progress Notes (Signed)
Patient discharged per physician order; patient denies SI/HI and A/V hallucinations; patient received samples, prescriptions, and copy of AVS after it was reviewed with him and his spouse per his request; patient had no other questions or concerns at this time; patient verbalized and signed that he received all belongings; patient left the unit ambulatory

## 2014-06-13 NOTE — [facility] Suicide Risk Assessment (Signed)
[facility]   Demographic Factors:  59 year old married male, lives with wife   Total Time spent with patient: 30 minutes  Musculoskeletal: Strength & Muscle Tone: within normal limits Gait & Station: normal Patient leans: N/A  Psychiatric Specialty Exam: Physical Exam  ROS  Blood pressure 107/74, pulse 123, temperature 98.2 F (36.8 C), temperature source Oral, resp. rate 17, height 6\' 1"  (1.854 m), weight 188 lb (85.276 kg).Body mass index is 24.81 kg/(m^2).  General Appearance: improved grooming  Eye Contact::  Good  Speech:  Normal Rate409  Volume:  Normal  Mood:  mood improved, affect brighter   Affect:  Appropriate and no longer presenting irritable   Thought Process:  Goal Directed and Linear  Orientation:  Full (Time, Place, and Person)  Thought Content:  still somatically focused, no hallucinations, no delusions   Suicidal Thoughts:  No- denies any thoughts of hurting self or anyone else   Homicidal Thoughts:  No  Memory: recent and remote grossly intact   Judgement:  Other:  improving   Insight:  improving   Psychomotor Activity:  Normal  Concentration:  Good  Recall:  Good  Fund of Knowledge:Good  Language: Good  Akathisia:  Negative  Handed:  Right  AIMS (if indicated):     Assets:  Desire for Improvement Resilience Social Support  Sleep:  Number of Hours: 2.25  Cognition: WNL  ADL's:  Improved    Have you used any form of tobacco in the last 30 days? (Cigarettes, Smokeless Tobacco, Cigars, and/or Pipes): No  Has this patient used any form of tobacco in the last 30 days? (Cigarettes, Smokeless Tobacco, Cigars, and/or Pipes) No  Mental Status Per Nursing Assessment::   On Admission:     Current Mental Status by Physician: Today patient seems much improved compared to his admission status. He is less depressed, less irritable, with a much brighter affect, smiles appropriately and is pleasant, calm, cooperative, still tends to  be somatically focused, states mood is better. No thought disorder, no SI or HI, no psychotic symptoms.  Loss Factors: Chronic pain, recent move from [place] , loss of established care givers when he moved   Historical Factors:  Depression, chronic pain   Risk Reduction Factors:   Sense of responsibility to family, Living with another person, especially a relative, Positive social support and Positive coping skills or problem solving skills  Continued Clinical Symptoms:  As noted, today much improved , no SI or HI, no psychotic symptoms, still somatically focused, more future oriented, no longer irritable.   Cognitive Features That Contribute To Risk:  Closed-mindedness    Suicide Risk:  Mild:  Suicidal ideation of limited frequency, intensity, duration, and specificity.  There are no identifiable plans, no associated intent, mild dysphoria and related symptoms, good self-control (both objective and subjective assessment), few other risk factors, and identifiable protective factors, including available and accessible social support.  Principal Problem: Major depressive disorder, recurrent severe without psychotic features Discharge Diagnoses:  Patient Active Problem List   Diagnosis Date Noted  . Major depressive disorder, recurrent severe without psychotic features [F33.2] 06/09/2014    Follow-up Information    Follow up with Florencia ReasonsPeggy Bynum [facility] On 06/27/2014.   Why:  You are scheduled with Florencia ReasonsPeggy Bynum on Wednesday, Jun 27, 2014 at 8 AM   Contact information:   [address] [place], [place]   [address]  [phone]      Follow up with  Dr. Tenny Craw - [facility] On 07/09/2014.   Why:  You are scheduled with Dr. Tenny Craw on Monday, Jul 09, 2014 at [facility] information:   [address] [place], [place]   [address]  [phone]      Plan Of Care/Follow-up recommendations:  Activity:  as tolerated Diet:  Regular Tests:   NA Other:  See below  Is patient on multiple antipsychotic therapies at discharge:  No   Has Patient had three or more failed trials of antipsychotic monotherapy by history:  No  Recommended Plan for Multiple Antipsychotic Therapies: NA   Patient is leaving unit in good spirits. He plans to return home- wife will pick him up. States he plans to follow up as above. Encouraged to follow up with his established  [facility] in [place]. Encouraged to establish PCP management  , for ongoing  Medical management and support .    Khanh Tanori 06/13/2014, 11:08 AM

## 2014-06-13 NOTE — [facility] Group Notes (Signed)
[facility]   06/13/2014 9:29 AM    Participation Quality:  Appropraite  Mood/Affect:  Appropriate  Depression Rating:  4  Anxiety Rating:  4  Thoughts of Suicide:  No  Will you contract for safety?   NA  Current AVH:  No  Plan for Discharge/Comments:  Patient attended discharge planning group and actively participated in group. He reports doing much better today and plans to discharge home.  [facility]  Suicide prevention education reviewed and SPE document provided.   Transportation Means: Patient has transportation.   Supports:  Patient has a support system.   Eliot Popper, Joesph JulyQuylle Hairston

## 2014-06-13 NOTE — Discharge Summary (Signed)
Physician Discharge Summary Note  Patient:  Joe Jennings is an 59 y.o., male MRN:  [id number] DOB:  1955/05/22 Patient phone:  [phone] (home)  Patient address:   [address] [place] [id number],  Total Time spent with patient: Greater than 30 minutes  Date of Admission:  06/09/2014 Date of Discharge: 06/13/2014  Reason for Admission:  Per H&P admission:  Joe Jennings is an 59 y.o. male, married, Caucasian who [facility] accompanied by his wife, who participated in assessment. Pt has a history of depression and describes how he has decompensated since his [place] six months ago. Pt states he feels he is "having a mental breakdown." Pt reports he has been unable to find a primary care physician or a psychiatrist. He states he has chronic pain in his head, neck and shoulders and he feels overwhelmed and hopeless. He reports symptoms including crying spells, poor concentrate, poor memory, loss of interest in usual pleasures, social withdrawal, irritability and feelings of sadness and hopelessness. Pt's wife reports that Pt has not been caring for hygiene and grooming. Pt and wife also report Pt has not been eating or drinking and has lost over twenty pounds in the past 3-4 weeks. Pt stays in bed but says he only sleeps 3-4 hours per night. He states he doesn't care about living but denies active suicide plan. He denies history of suicide attempts. He denies homicidal ideation or history of violence. He denies psychotic symptoms. He denies alcohol or other substance abuse. Pt denies abusing pain medications and wife corroborates this report.  Pt identifies chronic pain as his primary stressor. He reports experiencing multiple accidents and injuries over the years resulting in several surgeries. Pt states he was in a pain clinic in [place] and says he had no idea it would be so difficult to access services in [place]. Pt's wife  reports Pt was fired from his job in November 2015 due to his medical problems. Pt's wife states that Pt has always worked and being incapacitated has been very difficult. Pt lives with his wife of 32 years and their son, age 60. Pt states his wife is his only support. Pt denies any history of inpatient mental health or substance abuse treatment. Pt's mother has a history of mental health problems and inpatient psychiatric hospitalizations.  Pt is dressed in hospital scrubs, alert, oriented x4 with normal speech and restless motor behavior. Eye contact is fair. Pt kept looking to wife to answer questions and stated he couldn't concentrate. Pt's mood is depressed and affect is congruent with mood. Thought process is coherent and relevant. There is no indication Pt is currently responding to internal stimuli or experiencing delusional thought content. Pt was cooperative throughout assessment. He is agreeable to inpatient psychiatric treatment and asked if his pain would be addressed. I explained if he was admitted to [facility] it would be for depression, not pain management. Pt's wife states her husband is severely depressed and unable to function at this time.    Principal Problem: Major depressive disorder, recurrent severe without psychotic features Discharge Diagnoses: Patient Active Problem List   Diagnosis Date Noted  . Major depressive disorder, recurrent severe without psychotic features [F33.2] 06/09/2014    Musculoskeletal: Strength & Muscle Tone: within normal limits Gait & Station: normal Patient leans: N/A  Psychiatric Specialty Exam:  See Suicide Risk Assessment Physical Exam  Constitutional: He is oriented to person, place, and time.  Neck: Normal range of motion.  Respiratory: Effort normal.  Musculoskeletal: Normal range of motion.  Neurological: He is alert and oriented to person, place, and time.    Review of Systems  Psychiatric/Behavioral: Negative for suicidal ideas,  hallucinations and memory loss. Depression: Stable. Nervous/anxious: Stable. Insomnia: Stable.     Blood pressure 107/74, pulse 123, temperature 98.2 F (36.8 C), temperature source Oral, resp. rate 17, height  (1.854 m), weight 85.276 kg (188 lb).Body mass index is 24.81 kg/(m^2).    Past Medical History:  Past Medical History  Diagnosis Date  . Chronic pain   . GERD (gastroesophageal reflux disease)   . Headache   . Neuromuscular disorder   . Arthritis     Past Surgical History  Procedure Laterality Date  . Resection distal clavical  1993  . Acromial plasty Left 1993  . Rotator cuff repair Right 1996   Family History: History reviewed. No pertinent family history. Social History:  History  Alcohol Use No     History  Drug Use No    History   Social History  . Marital Status: Married    Spouse Name: N/A  . Number of Children: N/A  . Years of Education: N/A   Social History Main Topics  . Smoking status: Never Smoker   . Smokeless tobacco: Not on file  . Alcohol Use: No  . Drug Use: No  . Sexual Activity: Yes   Other Topics Concern  . None   Social History Narrative    Risk to Self: Is patient at risk for suicide?: Yes What has been your use of drugs/alcohol within the last 12 months?: no alcohol, no drugs Risk to Others:   Prior Inpatient Therapy:   Prior Outpatient Therapy:    Level of Care:  OP  Hospital Course:  Joe Jennings was admitted for Major depressive disorder, recurrent severe without psychotic features and crisis management.  He was treated discharged with the medications listed below under Medication List.  Medical problems were identified and treated as needed.  Home medications were restarted as appropriate.  Improvement was monitored by observation and Joe Jennings daily report of symptom reduction.  Emotional and mental status was monitored by daily self-inventory reports completed by Joe Jennings and clinical staff.          Joe Jennings was evaluated by the treatment team for stability and plans for continued recovery upon discharge.  Joe Jennings motivation was an integral factor for scheduling further treatment.  Employment, transportation, bed availability, health status, family support, and any pending legal issues were also considered during his hospital stay.  He was offered further treatment options upon discharge including but not limited to Residential, Intensive Outpatient, and Outpatient treatment.  Dimetri Armitage will follow up with the services as listed below under Follow Up Information.     Upon completion of this admission the patient was both mentally and medically stable for discharge denying suicidal/homicidal ideation, auditory/visual/tactile hallucinations, delusional thoughts and paranoia.      Consults:  psychiatry  Significant Diagnostic Studies:  labs: UDS, CMET, CBC/Diff, Urinalysis, ETOH,   Discharge Vitals:   Blood pressure 107/74, pulse 123, temperature 98.2 F (36.8 C), temperature source Oral, resp. rate 17, height  (1.854 m), weight 85.276 kg (188 lb). Body mass index is 24.81 kg/(m^2). Lab Results:   No results found for this or any previous visit (from the past 72 hour(s)).  Physical Findings: AIMS: Facial and Oral Movements Muscles of Facial Expression:  None, normal Lips and Perioral Area: None, normal Jaw: None, normal Tongue: None, normal,Extremity Movements Upper (arms, wrists, hands, fingers): None, normal Lower (legs, knees, ankles, toes): None, normal, Trunk Movements Neck, shoulders, hips: None, normal, Overall Severity Severity of abnormal movements (highest score from questions above): None, normal Incapacitation due to abnormal movements: None, normal Patient's awareness of abnormal movements (rate only patient's report): No Awareness, Dental Status Current problems with teeth and/or dentures?: No Does patient usually wear dentures?: No  CIWA:   CIWA-Ar Total: 10 COWS:      See Psychiatric Specialty Exam and Suicide Risk Assessment completed by Attending Physician prior to discharge.  Discharge destination:  Home  Is patient on multiple antipsychotic therapies at discharge:  No   Has Patient had three or more failed trials of antipsychotic monotherapy by history:  No    Recommended Plan for Multiple Antipsychotic Therapies: NA      Discharge Instructions    Activity as tolerated - No restrictions    Complete by:  As directed      Ambulatory referral to Pain Clinic    Complete by:  As directed      Diet general    Complete by:  As directed      Discharge instructions    Complete by:  As directed   Please follow up with your Primary Care Provider for further management of any chronic medical problems.     Discharge instructions    Complete by:  As directed   Take all of you medications as prescribed by your mental healthcare provider.  Report any adverse effects and reactions from your medications to your outpatient provider promptly. Do not engage in alcohol and or illegal drug use while on prescription medicines. In the event of worsening symptoms call the crisis hotline, 911, and or go to the nearest emergency department for appropriate evaluation and treatment of symptoms.  Follow-up with your primary care provider for your medical issues, concerns and or health care needs.   Keep all scheduled appointments.  If you are unable to keep an appointment call to reschedule.  Let the nurse know if you will need medications before next scheduled appointment.  Resources  Emergency Department Resource Guide 1) Find a Doctor and Pay Out of Pocket Although you won't have to find out who is covered by your insurance plan, it is a good idea to ask around and get recommendations. You will then need to call the office and see if the doctor you have chosen will accept you as a new patient and what types of options they offer for  patients who are self-pay. Some doctors offer discounts or will set up payment plans for their patients who do not have insurance, but you will need to ask so you aren't surprised when you get to your appointment.  2) Contact Your Local Health Department Not all health departments have doctors that can see patients for sick visits, but many do, so it is worth a call to see if yours does. If you don't know where your local health department is, you can check in your phone book. The CDC also has a tool to help you locate your state's health department, and many state websites also have listings of all of their local health departments.  3) Find a Walk-in Clinic If your illness is not likely to be very severe or complicated, you may want to try a walk in clinic. These are popping up all over the country  in pharmacies, drugstores, and shopping centers. They're usually staffed by nurse practitioners or physician assistants that have been trained to treat common illnesses and complaints. They're usually fairly quick and inexpensive. However, if you have serious medical issues or chronic medical problems, these are probably not your best option.  No Primary Care Doctor: Call Health Connect at  [phone] - they can help you locate a primary care doctor that  accepts your insurance, provides certain services, etc. Physician Referral Service- [phone]  Chronic Pain Problems: Organization         Address  Phone   Notes  [facility]  [phone] Patients need to be referred by their primary care doctor.  Medication Assistance: Organization         Address  Phone   Notes  [facility] Medication [facility] [address] [place] [place], [place] [address] [phone] --Must be a resident of [facility] -- Must have NO insurance coverage whatsoever (no Medicaid/ Medicare, etc.) -- The pt. MUST have a primary care doctor that directs their care regularly and  follows them in the community  MedAssist  [phone]  [employer]  [phone]   Agencies that provide inexpensive medical care: Organization         Address  Phone   Notes  [facility]  [phone]  [facility]    [phone]  [facility] [address] [place], [place] [address] [phone]  [facility] [address], [place] [phone]  Planned Parenthood    [phone]  [facility]    [phone]  [facility]  [address], [place] Phone:  [phone], Fax:  [phone] Hours of Operation:  9 am - 6 pm, M-F.  Also accepts Medicaid/Medicare and self-pay. [facility]  [address], [place] Phone: [phone], Fax: [phone]. Hours of Operation:  8:30 am - 5:30 pm, M-F.  Also accepts Medicaid and self-pay. [facility] [place] [address] [place] Phone: [phone]  [facility] [address] Natasha Bence [place], [place] [phone], Ext. [phone] Mondays & Thursdays: 7-9 AM.  First 15 patients are seen on a first come, first serve basis.   Medicaid-accepting [facility] Providers:  Organization         Address  Phone   Notes  [facility] [address], [place] [phone] Also accepts self-pay patients. [facility] [address] Laurell Josephs [place], [place]  [phone]  [facility] [address], [place] [phone]  [facility] [address], [place] [phone]  Renaye Rakers [address], [place]   [phone] Only accepts [place] Access [place] patients after they have their name applied to their card.  Self-Pay (no insurance) in [facility]:  Organization         Address  Phone   Notes  Sickle Cell Patients,  [facility] [address] [place], [place] [phone]  [facility] [address] [place], [place] [phone]  [facility] [place]  [address], [place] [phone]  [facility]/Dr. Osei-Bonsu  [address] [place] or [address], [place] (  [phone] Phone number for both [facility] and [place] locations is the same. Urgent Medical and [facility] [address] [place] [phone]  [facility] [address] [place] or [address] [phone]  [facility] [address], [place] [phone], phone; [phone], fax Sees patients 1st and 3rd Saturday of every month.  Must not qualify for public or private insurance (i.e. Medicaid, Medicare, [place] Health Choice, Veterans' Benefits) . Household income should be no more than 200% of the poverty level .The clinic cannot treat you if you are pregnant or think you are pregnant . Sexually transmitted diseases are not treated at the clinic.   Dental Care: Organization         Address  Phone  Notes  [facility] [address] [place], [place] [phone] Accepts children up to age 34 who are enrolled in [place] or [place] Health Choice; pregnant women with a Medicaid card; and children who have applied for Medicaid or [place] Health Choice, but were declined, whose parents can pay a reduced fee at time of service. [facility]  [address], [place] [phone] Accepts children up to age 48 who are enrolled in [place] or [place] Health Choice; pregnant women with a Medicaid card; and children who have applied for Medicaid or [place] Health Choice, but were declined, whose parents can pay a reduced fee at time of service. [facility]  [address] [place], [place] [phone] Patients are seen by appointment only. Walk-ins are not accepted. [facility] will see patients 24 years of age and older. Monday - Tuesday (8am-5pm) Most Wednesdays (8:30-5pm) $30 per visit, cash only [facility]  [address], [facility] [phone] Patients are seen by appointment only. Walk-ins are not accepted. [facility] will see patients 38 years of age and older. One Wednesday Evening (Monthly: Volunteer Based).  $30 per visit, cash only [employer]  [phone] for adults; Children under age 37, call Graduate Pediatric Dentistry at [phone]. Children aged 69-14, please call [phone] to request a pediatric application.  Dental services are provided in all areas of dental care including fillings, crowns and bridges, complete and partial dentures, implants, gum treatment, root canals, and extractions. Preventive care is also provided. Treatment is provided to both adults and children. Patients are selected via a lottery and there is often a waiting list.  [facility] [address], [place]  [phone] [email or web address]  [facility] [address] [place], [place] [phone], Ext. [phone] Second and Fourth Thursday of each month, opens at 6:30 AM; Clinic ends at 9 AM.  Patients are seen on a first-come first-served basis, and a limited number are seen during each clinic.  [facility]  [address] [place], [place] [phone]   Eligibility Requirements You must have lived in [place], [place], or [place] counties for at least the last three months.   You cannot be eligible for state or federal sponsored [place], including [employer], [place], or [employer].   You generally cannot be eligible for healthcare insurance through your employer.     How to apply: Eligibility screenings are held every  Tuesday and Wednesday afternoon from 1:00 pm until 4:00 pm. You do not need  an appointment for the interview! [facility] [address] [place], [place] [phone]  [facility] Health Department  [phone]  [facility] Health Department  [phone]  [facility] Department  [phone]   Behavioral Health Resources in the Community: Intensive Outpatient Programs Organization         Address  Phone  Notes  [facility] [address] [place], [place] [phone]  [facility] Outpatient [address] [place], [place] [phone]  [facility] [address], [place], [place]  [phone]  [facility] [address]  [place], [place] [phone] or [phone]  Substance Abuse Resources Organization         Address  Phone  Notes  Alcohol [facility] Drug Services  [phone]  [facility]  [phone]  [facility] [place]  [phone]  Floydene Flock  [phone]  Residential & Outpatient Substance Abuse Program  [phone]  Psychological Services Organization         Address  Phone  Notes  [facility] Behavioral Health  [phone]  [facility] Services  [phone]  [facility] Mental Health [address], [place] [phone] or [phone]   Mobile Crisis Teams Organization         Address  Phone  Notes  [facility], Mobile Crisis Care Unit  [phone]  [facility]  [address] [place], [place] [phone]  Doristine Locks [address] [place] [phone]   Self-Help/Support Groups Organization         Address  Phone             Notes  [facility] [place] - variety of support groups  336- [phone] Call for more information Narcotics Anonymous (NA), [facility] [address], [employer] [place]  2 meetings at this location  Nutritional therapist         Address  Phone  Notes  [facility] [address],    [place]  [phone]  [facility]  [address] [place] [address], [place], [place] [phone]  [facility] [address] [place], [place] [phone] Admissions: 8am-3pm M-F [facility] [address]    [place], [place] [phone]  [facility] [address] [place], [place], [place] [phone]  [facility] [address],  [place], [place] [phone]  [facility] - Intensive Outpatient [address] Laurell Josephs [address], [place], [place] [phone]  [facility] ([facility] [address] [place]  [place], [place] [phone] or [phone]  [facility] ([facility]) [address] [place], [place] [phone] Accepts Medicaid [facility] [place] [address]  [place] [phone] Substance Abuse/Addiction Treatment  [facility] Organization         Address  Phone  Notes  [facility]  [phone]  Angie Fava, PhD [address] Ervin Knack [place], [place]   [phone] or [phone]  [facility] Behavioral   [address] [place], [place] [phone]  [facility] [address], [place], [place] [phone] Insurance/Medicaid/sponsorship through [employer] and Families [address]                                    [place], [place] [phone] Therapy/tele-psych/case [facility]  [address]   [place], [place] [phone]   Dr. Lolly Mustache  [phone]  [facility]  [employer] [facility] Dept. 1) [address] [place] 2) [address] [place] 3)  [address], [place] [phone]  [phone]  [facility] Child Abuse Hotline [phone] or [phone] (After Hours)            Medication List     STOP taking these medications        clonazePAM 0.5 MG tablet  Commonly known as:  KLONOPIN     diphenhydrAMINE 25 MG tablet  Commonly known as:  BENADRYL     fentaNYL 25 MCG/HR patch  Commonly known as:  DURAGESIC - dosed mcg/hr      TAKE these medications      Indication   amoxicillin 500 MG capsule  Commonly known as:  AMOXIL  Take 2 capsules (1,000 mg total) by mouth every 8 (eight) hours.   Indication:  sinusitis     divalproex 500 MG DR tablet  Commonly known as:  DEPAKOTE  Take 1 tablet (500 mg total) by mouth 2 (two) times daily. For mood control   Indication:  Mood control     DULoxetine 30 MG capsule  Commonly known as:  CYMBALTA  Take 1 capsule (30 mg total) by mouth daily. For depression   Indication:  Generalized Anxiety Disorder, Major Depressive Disorder, Musculoskeletal Pain     hydrOXYzine 25 MG tablet  Commonly known as:  ATARAX/VISTARIL  Take 1 tablet (25 mg total) by mouth every 4 (four) hours as needed for anxiety.   Indication:  Anxiety Neurosis     multivitamin with minerals tablet  Take 1 tablet by mouth daily. For nutritional supplementation   Indication:  Vitamin Supplementation     pantoprazole 40 MG tablet  Commonly known as:  PROTONIX  Take 1 tablet (40 mg total) by mouth 2 (two) times daily. For GERD   Indication:  Gastroesophageal Reflux Disease     sucralfate 1 G tablet  Commonly known as:  CARAFATE  Take 1 tablet (1 g total) by mouth 4 (four) times daily -  with meals and at bedtime. For GERD   Indication:  Gastroesophageal Reflux Disease     SUMAtriptan 100 MG tablet  Commonly known as:  IMITREX  Take 1 tablet (100 mg total) by mouth every 6 (six) hours as needed.   Indication:  Migraine Headache     tiZANidine 4 MG tablet  Commonly known as:  ZANAFLEX  Take 1 tablet (4 mg total) by mouth 2 (two) times daily.   Indication:  Muscle Spasticity     topiramate 50 MG tablet  Commonly known as:  TOPAMAX  Take 1 tablet (50 mg  total) by mouth 2 (two) times daily.   Indication:  Migraine Headache     traZODone 150 MG tablet  Commonly known as:  DESYREL  Take 1 tablet (150 mg total) by mouth at bedtime.   Indication:  Trouble Sleeping     vitamin D (CHOLECALCIFEROL) 400 UNITS tablet  Take 1 tablet (400 Units total) by mouth daily. To supplement Vitamin D   Indication:  Vitamin D Deficiency       Follow-up Information    Follow up with Florencia Reasons [facility] On 06/27/2014.   Why:  You are scheduled with Florencia Reasons on Wednesday, Jun 27, 2014 at 8  AM   Contact information:   [address] [place], [place]   [address]  [phone]      Follow up with Dr. Tenny Craw - [facility] On 07/09/2014.   Why:  You are scheduled with Dr. Tenny Craw on Monday, Jul 09, 2014 at [facility] information:   [address] [place], [place]   [address]  [phone]      Follow up with [facility]:.   Why:  Call for an appointment   Contact information:   [address]  [phone]      Follow-up recommendations:  Activity:  As tolerated Diet:  As tolerated  Comments:   Patient has been instructed to take medications as prescribed; and report adverse effects to outpatient provider.  Follow up with primary doctor for any medical issues and If symptoms recur report to nearest emergency or crisis hot line.    Total Discharge Time: Greater than 30 minutes   Signed: Assunta Found, FNP-BC 06/13/2014, 1:04 PM    Patient seen, Suicide Assessment Completed.  Disposition Plan Reviewed Patient seen, Suicide Assessment Completed.  Disposition Plan Reviewed

## 2014-06-15 NOTE — Progress Notes (Signed)
Patient Discharge Instructions:  Next Level Care Provider Has Access to the EMR, 06/15/14  Records [facility] via [facility]/Epic access.  Jerelene ReddenSheena E Fishers, 06/15/2014, 4:01 PM

## 2014-06-27 ENCOUNTER — Ambulatory Visit (HOSPITAL_COMMUNITY): Payer: Self-pay | Admitting: Psychiatry

## 2014-07-09 ENCOUNTER — Encounter (HOSPITAL_COMMUNITY): Payer: Self-pay | Admitting: Psychiatry

## 2014-07-09 ENCOUNTER — Ambulatory Visit (INDEPENDENT_AMBULATORY_CARE_PROVIDER_SITE_OTHER): Payer: Federal, State, Local not specified - PPO | Admitting: Psychiatry

## 2014-07-09 VITALS — BP 136/78 | Ht 73.0 in | Wt 188.0 lb

## 2014-07-09 DIAGNOSIS — F411 Generalized anxiety disorder: Secondary | ICD-10-CM | POA: Diagnosis not present

## 2014-07-09 DIAGNOSIS — F332 Major depressive disorder, recurrent severe without psychotic features: Secondary | ICD-10-CM

## 2014-07-09 DIAGNOSIS — F322 Major depressive disorder, single episode, severe without psychotic features: Secondary | ICD-10-CM

## 2014-07-09 MED ORDER — ALPRAZOLAM 1 MG PO TABS: 1.0000 mg | ORAL_TABLET | Freq: Three times a day (TID) | ORAL | Status: DC | PRN

## 2014-07-09 MED ORDER — DULOXETINE HCL 60 MG PO CPEP: 60.0000 mg | ORAL_CAPSULE | Freq: Every day | ORAL | Status: DC

## 2014-07-09 MED ORDER — DIVALPROEX SODIUM ER 500 MG PO TB24: 1500.0000 mg | ORAL_TABLET | Freq: Every day | ORAL | Status: DC
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
## 2014-07-11 ENCOUNTER — Telehealth (HOSPITAL_COMMUNITY): Payer: Self-pay | Admitting: *Deleted

## 2014-07-11 NOTE — Telephone Encounter (Signed)
Called pt number on file and pt wife picked up. Left message with wife to have pt call office to resch scheduled appt for 08-06-14. Wife agreed to have pt call office. number provided

## 2014-08-02 ENCOUNTER — Telehealth (HOSPITAL_COMMUNITY): Payer: Self-pay | Admitting: *Deleted

## 2014-08-02 NOTE — Telephone Encounter (Signed)
patient left voice message asking if he can have blood work done on 08/03/14.

## 2014-08-02 NOTE — Telephone Encounter (Signed)
Tell him that will be fine

## 2014-08-03 NOTE — Telephone Encounter (Signed)
lmtcb

## 2014-08-06 ENCOUNTER — Ambulatory Visit (HOSPITAL_COMMUNITY): Payer: Self-pay | Admitting: Psychiatry

## 2014-08-06 NOTE — Telephone Encounter (Signed)
Informed pt what Dr. Tenny Craw stated and he showed understanding. Per pt, he also did not know where the lab was. He stated that he was just trying to see if he could get labs done as well. Informed pt what Dr. Tenny Craw stated and he agreed to answer.

## 2014-08-08 LAB — VALPROIC ACID LEVEL: Valproic Acid Lvl: 144.5 ug/mL — ABNORMAL HIGH (ref 50.0–100.0)

## 2014-08-09 ENCOUNTER — Other Ambulatory Visit (HOSPITAL_COMMUNITY): Payer: Self-pay | Admitting: Psychiatry

## 2014-08-09 ENCOUNTER — Ambulatory Visit (INDEPENDENT_AMBULATORY_CARE_PROVIDER_SITE_OTHER): Payer: Federal, State, Local not specified - PPO | Admitting: Psychiatry

## 2014-08-09 ENCOUNTER — Encounter (HOSPITAL_COMMUNITY): Payer: Self-pay | Admitting: Psychiatry

## 2014-08-09 VITALS — BP 113/73 | HR 76 | Ht 73.0 in | Wt 178.4 lb

## 2014-08-09 DIAGNOSIS — F332 Major depressive disorder, recurrent severe without psychotic features: Secondary | ICD-10-CM | POA: Diagnosis not present

## 2014-08-09 DIAGNOSIS — F322 Major depressive disorder, single episode, severe without psychotic features: Secondary | ICD-10-CM

## 2014-08-09 DIAGNOSIS — F411 Generalized anxiety disorder: Secondary | ICD-10-CM | POA: Diagnosis not present

## 2014-08-09 MED ORDER — ALPRAZOLAM 1 MG PO TABS: 1.0000 mg | ORAL_TABLET | Freq: Three times a day (TID) | ORAL | Status: DC | PRN

## 2014-08-09 MED ORDER — QUETIAPINE FUMARATE 50 MG PO TABS: ORAL_TABLET | ORAL | Status: DC

## 2014-08-09 MED ORDER — DIVALPROEX SODIUM ER 500 MG PO TB24: 1000.0000 mg | ORAL_TABLET | Freq: Every day | ORAL | Status: DC
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
## 2014-08-28 ENCOUNTER — Telehealth (HOSPITAL_COMMUNITY): Payer: Self-pay | Admitting: *Deleted

## 2014-08-28 NOTE — Telephone Encounter (Signed)
Pt called wanting to know if Dr. Tenny Crawoss heard anything back about the pain management clinic that she was going to referre pt to. Pt number is [phone]

## 2014-08-29 ENCOUNTER — Ambulatory Visit (HOSPITAL_COMMUNITY): Payer: Self-pay | Admitting: Psychiatry

## 2014-08-29 NOTE — Telephone Encounter (Signed)
Please call Dr. Gladys Dammeomquats office in [place] to see if we can refer him

## 2014-08-30 NOTE — Telephone Encounter (Signed)
Irving Burtonmily from practice called stating they are no longer taking new pain management patients.They are only doing [place], neurology and sleep medicine. Office number is [phone].

## 2014-08-30 NOTE — Telephone Encounter (Signed)
Called Dr. Gerilyn Pilgrimoonquah office and left message for office manager to contact office for ref information.

## 2014-08-30 NOTE — Telephone Encounter (Signed)
Please let him know we tried and were declined. He needs to get a PRIMARY MD first to make referrals

## 2014-08-31 NOTE — Telephone Encounter (Signed)
Informed pt of what Dr. Ross stated and pt showed understanding 

## 2014-08-31 NOTE — Telephone Encounter (Signed)
lmtcb

## 2014-09-05 ENCOUNTER — Ambulatory Visit (HOSPITAL_COMMUNITY): Payer: Self-pay | Admitting: Psychiatry

## 2014-10-04 ENCOUNTER — Encounter (HOSPITAL_COMMUNITY): Payer: Self-pay | Admitting: Psychiatry

## 2014-10-04 ENCOUNTER — Ambulatory Visit (INDEPENDENT_AMBULATORY_CARE_PROVIDER_SITE_OTHER): Payer: Federal, State, Local not specified - PPO | Admitting: Psychiatry

## 2014-10-04 VITALS — BP 115/76 | HR 65 | Ht 73.0 in | Wt 184.8 lb

## 2014-10-04 DIAGNOSIS — F322 Major depressive disorder, single episode, severe without psychotic features: Secondary | ICD-10-CM | POA: Diagnosis not present

## 2014-10-04 DIAGNOSIS — F411 Generalized anxiety disorder: Secondary | ICD-10-CM

## 2014-10-04 MED ORDER — DIVALPROEX SODIUM ER 500 MG PO TB24: 1000.0000 mg | ORAL_TABLET | Freq: Every day | ORAL | Status: DC

## 2014-10-04 MED ORDER — QUETIAPINE FUMARATE 50 MG PO TABS: ORAL_TABLET | ORAL | Status: DC

## 2014-10-04 MED ORDER — DULOXETINE HCL 60 MG PO CPEP: 60.0000 mg | ORAL_CAPSULE | Freq: Every day | ORAL | Status: DC

## 2014-10-04 MED ORDER — ALPRAZOLAM 1 MG PO TABS: 1.0000 mg | ORAL_TABLET | Freq: Three times a day (TID) | ORAL | Status: DC | PRN
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
## 2014-10-05 ENCOUNTER — Ambulatory Visit (HOSPITAL_COMMUNITY): Payer: Self-pay | Admitting: Psychiatry

## 2014-10-26 ENCOUNTER — Telehealth (HOSPITAL_COMMUNITY): Payer: Self-pay | Admitting: *Deleted

## 2014-10-30 ENCOUNTER — Ambulatory Visit (HOSPITAL_COMMUNITY): Payer: Self-pay | Admitting: Psychiatry

## 2014-11-15 ENCOUNTER — Ambulatory Visit (HOSPITAL_COMMUNITY): Payer: Self-pay | Admitting: Psychiatry

## 2015-01-29 ENCOUNTER — Telehealth (HOSPITAL_COMMUNITY): Payer: Self-pay | Admitting: *Deleted

## 2015-01-29 ENCOUNTER — Other Ambulatory Visit (HOSPITAL_COMMUNITY): Payer: Self-pay | Admitting: Psychiatry

## 2015-01-29 MED ORDER — DULOXETINE HCL 60 MG PO CPEP: 60.0000 mg | ORAL_CAPSULE | Freq: Every day | ORAL | Status: DC

## 2015-01-29 NOTE — Telephone Encounter (Signed)
been [facility] & forth to [facility], chronic pain management process.  He is out of the generic Cymbalta & Alprazalom.

## 2015-01-29 NOTE — Telephone Encounter (Signed)
Pt called stating he is out of his Cymbalta and Xanax. Both medication last filled 10-04-14 with 2 refills. Pt f/u appt is scheduled for 03-04-15.Pt number is [phone]

## 2015-01-29 NOTE — Telephone Encounter (Signed)
cymbalta sent in. You may call in one month supply of xanax

## 2015-01-30 ENCOUNTER — Encounter (HOSPITAL_COMMUNITY): Payer: Self-pay | Admitting: Psychiatry

## 2015-01-30 ENCOUNTER — Telehealth (HOSPITAL_COMMUNITY): Payer: Self-pay | Admitting: *Deleted

## 2015-01-30 ENCOUNTER — Ambulatory Visit (INDEPENDENT_AMBULATORY_CARE_PROVIDER_SITE_OTHER): Payer: Federal, State, Local not specified - PPO | Admitting: Psychiatry

## 2015-01-30 VITALS — BP 116/79 | HR 85 | Ht 73.0 in | Wt 201.8 lb

## 2015-01-30 DIAGNOSIS — F322 Major depressive disorder, single episode, severe without psychotic features: Secondary | ICD-10-CM | POA: Diagnosis not present

## 2015-01-30 DIAGNOSIS — F411 Generalized anxiety disorder: Secondary | ICD-10-CM

## 2015-01-30 MED ORDER — BUPROPION HCL ER (XL) 300 MG PO TB24: 300.0000 mg | ORAL_TABLET | ORAL | Status: DC

## 2015-01-30 MED ORDER — ALPRAZOLAM 1 MG PO TABS: 1.0000 mg | ORAL_TABLET | Freq: Three times a day (TID) | ORAL | Status: DC | PRN

## 2015-01-30 MED ORDER — AMITRIPTYLINE HCL 50 MG PO TABS: 50.0000 mg | ORAL_TABLET | Freq: Every day | ORAL | Status: DC

## 2015-01-30 NOTE — Telephone Encounter (Signed)
Medication was called into pharmacy by RMA and printed script was also given to pt by provider when he came into office for f/u
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
## 2015-01-30 NOTE — Telephone Encounter (Signed)
Called pt pharmacy and spoke with Maryla MorrowGary Shaffer and informed him of pt script that was being called  In and he showed understanding.

## 2015-03-04 ENCOUNTER — Ambulatory Visit (HOSPITAL_COMMUNITY): Payer: Self-pay | Admitting: Psychiatry

## 2015-03-07 ENCOUNTER — Other Ambulatory Visit (HOSPITAL_COMMUNITY): Payer: Self-pay | Admitting: Psychiatry

## 2015-03-07 ENCOUNTER — Ambulatory Visit (HOSPITAL_COMMUNITY): Payer: Self-pay | Admitting: Psychiatry

## 2015-04-02 ENCOUNTER — Ambulatory Visit (HOSPITAL_COMMUNITY): Payer: Self-pay | Admitting: Psychiatry

## 2015-04-12 ENCOUNTER — Encounter (HOSPITAL_COMMUNITY): Payer: Self-pay | Admitting: Psychiatry

## 2015-04-12 ENCOUNTER — Ambulatory Visit (INDEPENDENT_AMBULATORY_CARE_PROVIDER_SITE_OTHER): Payer: Federal, State, Local not specified - PPO | Admitting: Psychiatry

## 2015-04-12 VITALS — BP 130/82 | Ht 73.0 in | Wt 205.0 lb

## 2015-04-12 DIAGNOSIS — F411 Generalized anxiety disorder: Secondary | ICD-10-CM

## 2015-04-12 DIAGNOSIS — F322 Major depressive disorder, single episode, severe without psychotic features: Secondary | ICD-10-CM | POA: Diagnosis not present

## 2015-04-12 MED ORDER — BUPROPION HCL ER (XL) 300 MG PO TB24: 300.0000 mg | ORAL_TABLET | ORAL | Status: DC

## 2015-04-12 MED ORDER — ALPRAZOLAM 1 MG PO TABS: 1.0000 mg | ORAL_TABLET | Freq: Three times a day (TID) | ORAL | Status: DC | PRN
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
## 2015-04-17 ENCOUNTER — Telehealth (HOSPITAL_COMMUNITY): Payer: Self-pay | Admitting: *Deleted

## 2015-04-17 NOTE — Telephone Encounter (Signed)
Pt called office and informed front desk to update his medication list. Per pt, he does not take his Fentanyl 25 mcg/hr patch and his Tizanidine 4 mg. RMA fixed it in pt chart.

## 2015-05-12 ENCOUNTER — Other Ambulatory Visit (HOSPITAL_COMMUNITY): Payer: Self-pay | Admitting: Psychiatry

## 2015-05-23 ENCOUNTER — Telehealth (HOSPITAL_COMMUNITY): Payer: Self-pay | Admitting: *Deleted

## 2015-05-31 ENCOUNTER — Encounter (HOSPITAL_COMMUNITY): Payer: Self-pay | Admitting: Psychiatry

## 2015-05-31 ENCOUNTER — Ambulatory Visit (INDEPENDENT_AMBULATORY_CARE_PROVIDER_SITE_OTHER): Payer: Federal, State, Local not specified - PPO | Admitting: Psychiatry

## 2015-05-31 VITALS — BP 119/81 | HR 81 | Ht 73.0 in | Wt 204.4 lb

## 2015-05-31 DIAGNOSIS — F322 Major depressive disorder, single episode, severe without psychotic features: Secondary | ICD-10-CM

## 2015-05-31 MED ORDER — BUPROPION HCL ER (XL) 300 MG PO TB24: 300.0000 mg | ORAL_TABLET | ORAL | Status: DC

## 2015-05-31 MED ORDER — ALPRAZOLAM 1 MG PO TABS: 1.0000 mg | ORAL_TABLET | Freq: Three times a day (TID) | ORAL | Status: DC | PRN
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
## 2015-06-12 ENCOUNTER — Ambulatory Visit (HOSPITAL_COMMUNITY): Payer: Self-pay | Admitting: Psychiatry

## 2015-08-29 ENCOUNTER — Other Ambulatory Visit (HOSPITAL_COMMUNITY): Payer: Self-pay | Admitting: Psychiatry

## 2015-08-30 ENCOUNTER — Encounter (HOSPITAL_COMMUNITY): Payer: Self-pay | Admitting: Psychiatry

## 2015-08-30 ENCOUNTER — Ambulatory Visit (INDEPENDENT_AMBULATORY_CARE_PROVIDER_SITE_OTHER): Payer: Federal, State, Local not specified - PPO | Admitting: Psychiatry

## 2015-08-30 VITALS — BP 125/80 | HR 77 | Ht 73.0 in | Wt 202.0 lb

## 2015-08-30 DIAGNOSIS — F322 Major depressive disorder, single episode, severe without psychotic features: Secondary | ICD-10-CM

## 2015-08-30 MED ORDER — ALPRAZOLAM 1 MG PO TABS: 1.0000 mg | ORAL_TABLET | Freq: Three times a day (TID) | ORAL | Status: DC | PRN

## 2015-08-30 MED ORDER — BUPROPION HCL ER (XL) 300 MG PO TB24: 300.0000 mg | ORAL_TABLET | Freq: Every day | ORAL | Status: DC
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
## 2015-11-27 ENCOUNTER — Ambulatory Visit (INDEPENDENT_AMBULATORY_CARE_PROVIDER_SITE_OTHER): Payer: Federal, State, Local not specified - PPO | Admitting: Psychiatry

## 2015-11-27 ENCOUNTER — Encounter (HOSPITAL_COMMUNITY): Payer: Self-pay | Admitting: Psychiatry

## 2015-11-27 VITALS — BP 126/75 | HR 74 | Ht 73.0 in | Wt 202.2 lb

## 2015-11-27 DIAGNOSIS — F322 Major depressive disorder, single episode, severe without psychotic features: Secondary | ICD-10-CM | POA: Diagnosis not present

## 2015-11-27 DIAGNOSIS — F411 Generalized anxiety disorder: Secondary | ICD-10-CM

## 2015-11-27 MED ORDER — ALPRAZOLAM 1 MG PO TABS: 1.0000 mg | ORAL_TABLET | Freq: Three times a day (TID) | ORAL | 2 refills | Status: DC | PRN

## 2015-11-27 MED ORDER — BUPROPION HCL ER (XL) 300 MG PO TB24: 300.0000 mg | ORAL_TABLET | Freq: Every day | ORAL | 2 refills | Status: DC
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
## 2015-12-10 ENCOUNTER — Telehealth (HOSPITAL_COMMUNITY): Payer: Self-pay | Admitting: *Deleted

## 2015-12-10 NOTE — Telephone Encounter (Signed)
Spoke with pt. See note in epic 

## 2015-12-10 NOTE — Telephone Encounter (Signed)
Voice message from patient.   He need Dr. Tenny Crawoss to let [facility] know that she do not prescribe his pain medication.

## 2015-12-10 NOTE — Telephone Encounter (Signed)
Pt called stating he went to [facility] yesterday for an appt. Per pt, his provider there increased his Oxycodone to 10 mg QID and now the insurance will not pay for it. When he called the pain med clinic, per pt he was informed that he had to pick either his pain medication or his Xanax. Per pt, his doctor at the pain medication clinic with [facility] wants to speak with Dr. Tenny Crawoss so Dr. Tenny Crawoss can tell her that she will no longer prescribe him Xanax. Per pt he uses Xanax for his Anxiety and if it comes down to it, he needs his pain medication. Informed pt that office need a release of information signed before his pain management clinic could talk to Dr. Tenny Crawoss. Pt stated he will come by office to sign release.

## 2015-12-11 ENCOUNTER — Telehealth (HOSPITAL_COMMUNITY): Payer: Self-pay | Admitting: *Deleted

## 2015-12-11 ENCOUNTER — Encounter (HOSPITAL_COMMUNITY): Payer: Self-pay | Admitting: Psychiatry

## 2015-12-11 NOTE — Telephone Encounter (Signed)
Called pt and lmtcb

## 2015-12-11 NOTE — Telephone Encounter (Signed)
Pt came into office to fill out release for Dr. Tenny Crawoss and [facility] to communicate. Per pt, he would like for Dr. Tenny Crawoss to let them know that she will no longer be prescribing him any Benzo due to him being on Oxycodone 20 mg 5 times daily. Per pt, [facility] at [facility] is Rochele RaringKarean McCain and their number is [phone] or [phone]. Per pt he rather stay on his pain meds then his nerve pills. Per pt his insurance will not approve his pharmacy to fill his pain meds until his pain clinic and Dr. Tenny Crawoss communicate.

## 2015-12-11 NOTE — Telephone Encounter (Signed)
Faxed letter provider wrote for pt to [facility] to [phone]

## 2015-12-11 NOTE — Telephone Encounter (Signed)
[facility] to [facility]. We have to fax a note to them stating that he is no longer being prescribed xanax. Also, call pharmacy and cancel xanax prescription

## 2015-12-11 NOTE — Telephone Encounter (Signed)
Spoke with pt and informed him letter completed and faxed and pt verbalized understanding

## 2015-12-27 ENCOUNTER — Other Ambulatory Visit (HOSPITAL_COMMUNITY): Payer: Self-pay | Admitting: Psychiatry

## 2015-12-27 ENCOUNTER — Telehealth (HOSPITAL_COMMUNITY): Payer: Self-pay | Admitting: *Deleted

## 2015-12-27 MED ORDER — BUPROPION HCL ER (XL) 300 MG PO TB24: 300.0000 mg | ORAL_TABLET | Freq: Every day | ORAL | 2 refills | Status: DC

## 2015-12-27 NOTE — Telephone Encounter (Signed)
voice message from patient, he needs Wellbutrin called in to his pharmacy.   He is out.

## 2015-12-27 NOTE — Telephone Encounter (Signed)
It was sent on 10/4, resent today

## 2015-12-30 NOTE — Telephone Encounter (Signed)
lmtcb to pt and office number provided.

## 2015-12-31 NOTE — Telephone Encounter (Signed)
Pt called and lm stating his medication was at the pharmacy since Friday and they just did not call him to pick it up. Per pt he already have his medication and to disregard the message.

## 2016-02-06 ENCOUNTER — Telehealth (HOSPITAL_COMMUNITY): Payer: Self-pay | Admitting: *Deleted

## 2016-02-06 NOTE — Telephone Encounter (Signed)
phone call from patient, said he is out of his medications and his anxiety is high.   [facility] is giving him a problem.  He would like [place] call him.

## 2016-02-10 NOTE — Telephone Encounter (Signed)
Does he need refills. If so, you my call in 30 day supply

## 2016-02-10 NOTE — Telephone Encounter (Signed)
lmtcb on home number

## 2016-02-10 NOTE — Telephone Encounter (Signed)
Per pt he will talk in details with Dr. Tenny Crawoss during his appt.

## 2016-02-10 NOTE — Telephone Encounter (Signed)
Called pt cell and spoke with him. Per pt his pain management is giving him a hard time. Per pt his pain medication for his chronic pain is not working, the earliest they can have him come in is Dec 28th and his anxiety is really bad right now. Per pt, he when he goes for his appt with [facility] and they do not get his medications right, he might as well just go back on his Xanax because it was working better. Per pt he had Dr. Tenny Crawoss write the letter to say she will stop giving his Xanax but right now he feels like Xanax was working better then what the pain clinic is giving him. Per pt he will discuss this in more details with Dr. Tenny Crawoss when he comes for his f/u on 02-26-2016.

## 2016-02-26 ENCOUNTER — Ambulatory Visit (INDEPENDENT_AMBULATORY_CARE_PROVIDER_SITE_OTHER): Payer: Federal, State, Local not specified - PPO | Admitting: Psychiatry

## 2016-02-26 ENCOUNTER — Encounter (INDEPENDENT_AMBULATORY_CARE_PROVIDER_SITE_OTHER): Payer: Self-pay

## 2016-02-26 ENCOUNTER — Encounter (HOSPITAL_COMMUNITY): Payer: Self-pay | Admitting: Psychiatry

## 2016-02-26 VITALS — BP 121/84 | HR 86 | Ht 73.0 in | Wt 199.8 lb

## 2016-02-26 DIAGNOSIS — F322 Major depressive disorder, single episode, severe without psychotic features: Secondary | ICD-10-CM

## 2016-02-26 DIAGNOSIS — F411 Generalized anxiety disorder: Secondary | ICD-10-CM

## 2016-02-26 DIAGNOSIS — Z79891 Long term (current) use of opiate analgesic: Secondary | ICD-10-CM

## 2016-02-26 DIAGNOSIS — Z818 Family history of other mental and behavioral disorders: Secondary | ICD-10-CM

## 2016-02-26 DIAGNOSIS — Z79899 Other long term (current) drug therapy: Secondary | ICD-10-CM | POA: Diagnosis not present

## 2016-02-26 MED ORDER — BUSPIRONE HCL 10 MG PO TABS: 10.0000 mg | ORAL_TABLET | Freq: Three times a day (TID) | ORAL | 2 refills | Status: DC

## 2016-02-26 MED ORDER — BUPROPION HCL ER (XL) 300 MG PO TB24: 300.0000 mg | ORAL_TABLET | Freq: Every day | ORAL | 2 refills | Status: DC
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
## 2016-04-10 ENCOUNTER — Encounter (HOSPITAL_COMMUNITY): Payer: Self-pay

## 2016-04-10 ENCOUNTER — Ambulatory Visit (HOSPITAL_COMMUNITY): Payer: Self-pay | Admitting: Psychiatry

## 2016-06-28 ENCOUNTER — Other Ambulatory Visit (HOSPITAL_COMMUNITY): Payer: Self-pay | Admitting: Psychiatry

## 2016-09-02 ENCOUNTER — Encounter (HOSPITAL_COMMUNITY): Payer: Self-pay | Admitting: Psychiatry

## 2016-09-02 ENCOUNTER — Ambulatory Visit (INDEPENDENT_AMBULATORY_CARE_PROVIDER_SITE_OTHER): Payer: Federal, State, Local not specified - PPO | Admitting: Psychiatry

## 2016-09-02 VITALS — BP 130/78 | HR 72 | Resp 16 | Wt 198.0 lb

## 2016-09-02 DIAGNOSIS — F322 Major depressive disorder, single episode, severe without psychotic features: Secondary | ICD-10-CM

## 2016-09-02 DIAGNOSIS — Z818 Family history of other mental and behavioral disorders: Secondary | ICD-10-CM

## 2016-09-02 DIAGNOSIS — F411 Generalized anxiety disorder: Secondary | ICD-10-CM

## 2016-09-02 MED ORDER — BUPROPION HCL ER (XL) 300 MG PO TB24: 300.0000 mg | ORAL_TABLET | Freq: Every day | ORAL | 2 refills | Status: DC
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
## 2016-12-03 ENCOUNTER — Ambulatory Visit (HOSPITAL_COMMUNITY): Payer: Self-pay | Admitting: Psychiatry

## 2016-12-17 ENCOUNTER — Ambulatory Visit (INDEPENDENT_AMBULATORY_CARE_PROVIDER_SITE_OTHER): Payer: Federal, State, Local not specified - PPO | Admitting: Psychiatry

## 2016-12-17 ENCOUNTER — Encounter (HOSPITAL_COMMUNITY): Payer: Self-pay | Admitting: Psychiatry

## 2016-12-17 VITALS — BP 127/80 | HR 74 | Ht 73.0 in | Wt 196.0 lb

## 2016-12-17 DIAGNOSIS — G4700 Insomnia, unspecified: Secondary | ICD-10-CM | POA: Diagnosis not present

## 2016-12-17 DIAGNOSIS — G8929 Other chronic pain: Secondary | ICD-10-CM

## 2016-12-17 DIAGNOSIS — F322 Major depressive disorder, single episode, severe without psychotic features: Secondary | ICD-10-CM | POA: Diagnosis not present

## 2016-12-17 DIAGNOSIS — Z79899 Other long term (current) drug therapy: Secondary | ICD-10-CM | POA: Diagnosis not present

## 2016-12-17 DIAGNOSIS — Z818 Family history of other mental and behavioral disorders: Secondary | ICD-10-CM

## 2016-12-17 MED ORDER — BUPROPION HCL ER (XL) 300 MG PO TB24: 300.0000 mg | ORAL_TABLET | Freq: Every day | ORAL | 2 refills | Status: DC
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
## 2017-03-18 ENCOUNTER — Ambulatory Visit (HOSPITAL_COMMUNITY): Payer: Self-pay | Admitting: Psychiatry

## 2017-04-02 ENCOUNTER — Other Ambulatory Visit (HOSPITAL_COMMUNITY): Payer: Self-pay | Admitting: Psychiatry

## 2017-04-12 ENCOUNTER — Other Ambulatory Visit (HOSPITAL_COMMUNITY): Payer: Self-pay | Admitting: Psychiatry

## 2017-04-21 ENCOUNTER — Ambulatory Visit (HOSPITAL_COMMUNITY): Payer: Federal, State, Local not specified - PPO | Admitting: Psychiatry

## 2017-04-21 ENCOUNTER — Encounter (HOSPITAL_COMMUNITY): Payer: Self-pay | Admitting: Psychiatry

## 2017-04-21 VITALS — BP 148/77 | HR 66 | Ht 73.0 in | Wt 202.0 lb

## 2017-04-21 DIAGNOSIS — M2550 Pain in unspecified joint: Secondary | ICD-10-CM | POA: Diagnosis not present

## 2017-04-21 DIAGNOSIS — M549 Dorsalgia, unspecified: Secondary | ICD-10-CM

## 2017-04-21 DIAGNOSIS — Z818 Family history of other mental and behavioral disorders: Secondary | ICD-10-CM

## 2017-04-21 DIAGNOSIS — M542 Cervicalgia: Secondary | ICD-10-CM

## 2017-04-21 DIAGNOSIS — Z560 Unemployment, unspecified: Secondary | ICD-10-CM | POA: Diagnosis not present

## 2017-04-21 DIAGNOSIS — F322 Major depressive disorder, single episode, severe without psychotic features: Secondary | ICD-10-CM

## 2017-04-21 DIAGNOSIS — G4700 Insomnia, unspecified: Secondary | ICD-10-CM

## 2017-04-21 MED ORDER — BUPROPION HCL ER (XL) 300 MG PO TB24: ORAL_TABLET | ORAL | 2 refills | Status: DC

## 2017-04-21 MED ORDER — HYDROXYZINE HCL 50 MG PO TABS: 50.0000 mg | ORAL_TABLET | Freq: Every day | ORAL | 0 refills | Status: DC
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
## 2017-07-06 ENCOUNTER — Other Ambulatory Visit (HOSPITAL_COMMUNITY): Payer: Self-pay | Admitting: Psychiatry

## 2017-07-21 ENCOUNTER — Ambulatory Visit (HOSPITAL_COMMUNITY): Payer: Self-pay | Admitting: Psychiatry

## 2017-08-12 ENCOUNTER — Ambulatory Visit (HOSPITAL_COMMUNITY): Payer: Federal, State, Local not specified - PPO | Admitting: Psychiatry

## 2017-08-12 ENCOUNTER — Encounter (HOSPITAL_COMMUNITY): Payer: Self-pay | Admitting: Psychiatry

## 2017-08-12 VITALS — BP 116/77 | HR 78 | Ht 73.0 in | Wt 200.0 lb

## 2017-08-12 DIAGNOSIS — F322 Major depressive disorder, single episode, severe without psychotic features: Secondary | ICD-10-CM

## 2017-08-12 MED ORDER — BUPROPION HCL ER (XL) 300 MG PO TB24: ORAL_TABLET | ORAL | 2 refills | Status: DC
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
## 2017-08-19 ENCOUNTER — Ambulatory Visit: Payer: Federal, State, Local not specified - PPO | Admitting: Orthopaedic Surgery

## 2017-08-19 ENCOUNTER — Encounter: Payer: Self-pay | Admitting: Orthopaedic Surgery

## 2017-08-19 ENCOUNTER — Ambulatory Visit (INDEPENDENT_AMBULATORY_CARE_PROVIDER_SITE_OTHER): Payer: Federal, State, Local not specified - PPO

## 2017-08-19 VITALS — BP 140/76 | HR 79 | Temp 98.8°F | Ht 73.0 in | Wt 201.0 lb

## 2017-08-19 DIAGNOSIS — G8929 Other chronic pain: Secondary | ICD-10-CM | POA: Diagnosis not present

## 2017-08-19 DIAGNOSIS — M25561 Pain in right knee: Secondary | ICD-10-CM | POA: Diagnosis not present

## 2017-08-19 NOTE — Progress Notes (Signed)
Subjective:    Patient ID: Joe Jennings, male    DOB: 1955-12-19, 62 y.o.   MRN: [id number]  HPI He has had pain and tenderness of the right knee for several months now.  It has gradually started and is getting worse and worse.  He has popping and swelling and medial pain.  He has no giving way.  He has more pain when first standing on it from lying down at night or sitting in a chair a while.  He has no trauma, no redness, no numbness.  He has been on significant pain medicine for chronic pain syndrome with neck and back pain.  He is disabled secondary to the chronic pain syndrome.  The knee pain is new over the last several months.  He says the pain medicine has not helped his knee.   Review of Systems  Constitutional: Positive for activity change.  Respiratory: Negative for cough and shortness of breath.   Cardiovascular: Negative for chest pain and leg swelling.  Musculoskeletal: Positive for arthralgias, gait problem and joint swelling.  Neurological: Positive for headaches.  All other systems reviewed and are negative.  Past Medical History:  Diagnosis Date  . Arthritis   . Chronic pain   . Depression   . GERD (gastroesophageal reflux disease)   . Headache   . Neuromuscular disorder [facility])     Past Surgical History:  Procedure Laterality Date  . ABDOMINAL SURGERY     vericaseal per patient history   . acromial plasty Left 1993  . RESECTION DISTAL CLAVICAL  1993  . ROTATOR CUFF REPAIR Right 1996  . SHOULDER SURGERY Bilateral     Current Outpatient Medications on File Prior to Visit  Medication Sig Dispense Refill  . buPROPion (WELLBUTRIN XL) 300 MG 24 hr tablet TAKE 1 TABLET(300 MG) BY MOUTH DAILY 90 tablet 2  . Celecoxib (CELEBREX PO) Take by mouth 2 (two) times daily.    . Cyclobenzaprine HCl (FLEXERIL PO) Take by mouth 2 (two) times daily.    . fenofibrate (TRICOR) 145 MG tablet Take 145 mg by mouth daily.    . Multiple Vitamins-Minerals (MULTIVITAMIN WITH  MINERALS) tablet Take 1 tablet by mouth daily. For nutritional supplementation 30 tablet   . Omega-3 Fatty Acids (FISH OIL CONCENTRATE) 300 MG CAPS Take 300 mg by mouth daily.    Marland Kitchen oxyCODONE (OXYCONTIN) 60 MG 12 hr tablet Take by mouth every 12 (twelve) hours.    Marland Kitchen oxycodone (ROXICODONE) 30 MG immediate release tablet Take 30 mg by mouth every 4 (four) hours as needed for pain.    . Pantoprazole Sodium (PROTONIX PO) Take by mouth daily.    . polyethylene glycol (MIRALAX / GLYCOLAX) packet Take 17 g by mouth daily.    Marland Kitchen testosterone cypionate (DEPOTESTOSTERONE CYPIONATE) 200 MG/ML injection     . Vitamin D, Ergocalciferol, (DRISDOL) 50000 units CAPS capsule Take 50,000 Units by mouth every 7 (seven) days.     No current facility-administered medications on file prior to visit.     Social History   Socioeconomic History  . Marital status: Married    Spouse name: Not on file  . Number of children: Not on file  . Years of education: Not on file  . Highest education level: Not on file  Occupational History  . Not on file  Social Needs  . Financial resource strain: Not on file  . Food insecurity:    Worry: Not on file    Inability: Not on file  .  Transportation needs:    Medical: Not on file    Non-medical: Not on file  Tobacco Use  . Smoking status: Never Smoker  . Smokeless tobacco: Never Used  Substance and Sexual Activity  . Alcohol use: No  . Drug use: No  . Sexual activity: Yes  Lifestyle  . Physical activity:    Days per week: Not on file    Minutes per session: Not on file  . Stress: Not on file  Relationships  . Social connections:    Talks on phone: Not on file    Gets together: Not on file    Attends religious service: Not on file    Active member of club or organization: Not on file    Attends meetings of clubs or organizations: Not on file    Relationship status: Not on file  . Intimate partner violence:    Fear of current or ex partner: Not on file     Emotionally abused: Not on file    Physically abused: Not on file    Forced sexual activity: Not on file  Other Topics Concern  . Not on file  Social History Narrative  . Not on file    Family History  Problem Relation Age of Onset  . Bipolar disorder Mother   . Kidney disease Mother   . Bipolar disorder Sister   . Bipolar disorder Sister   . High blood pressure Father   . Diabetes Father     BP 140/76   Pulse 79   Temp 98.8 F (37.1 C)   Ht 6\' 1"  (1.854 m)   Wt 201 lb (91.2 kg)   BMI 26.52 kg/m   Body mass index is 26.52 kg/m.      Objective:   Physical Exam  Constitutional: He is oriented to person, place, and time. He appears well-developed and well-nourished.  HENT:  Head: Normocephalic and atraumatic.  Eyes: Pupils are equal, round, and reactive to light. Conjunctivae and EOM are normal.  Neck: Normal range of motion. Neck supple.  Cardiovascular: Normal rate, regular rhythm and intact distal pulses.  Pulmonary/Chest: Effort normal.  Abdominal: Soft.  Musculoskeletal:       Right knee: He exhibits swelling and effusion. Tenderness found. Medial joint line tenderness noted.       Legs: Neurological: He is alert and oriented to person, place, and time. He has normal reflexes. He displays normal reflexes. No cranial nerve deficit. He exhibits normal muscle tone. Coordination normal.  Skin: Skin is warm and dry.  Psychiatric: He has a normal mood and affect. His behavior is normal. Judgment and thought content normal.     X-rays were done of the right knee, reported separately.     Assessment & Plan:   Encounter Diagnosis  Name Primary?  . Chronic pain of right knee Yes   I am concerned about a medial meniscus tear of the right knee.  I would like to get a MRI of the knee.  PROCEDURE NOTE:  The patient requests injections of the right knee , verbal consent was obtained.  The right knee was prepped appropriately after time out was performed.    Sterile technique was observed and injection of 1 cc of Depo-Medrol 40 mg with several cc's of plain xylocaine. Anesthesia was provided by ethyl chloride and a 20-gauge needle was used to inject the knee area. The injection was tolerated well.  A band aid dressing was applied.  The patient was advised to apply ice  later today and tomorrow to the injection sight as needed.  Return after the MRI.  Call if any problem.  Precautions discussed.   Electronically Signed Darreld Mclean, MD 6/27/20192:21 PM

## 2017-08-19 NOTE — Patient Instructions (Signed)
..  A referral has been made for you to [facility].  They will call you to schedule the appointment.  If you do not hear from them within a week, please call [phone] and ask for their new patient coordinator.  She should be able to schedule you at that time. 

## 2017-08-25 ENCOUNTER — Ambulatory Visit (HOSPITAL_COMMUNITY)
Admission: RE | Admit: 2017-08-25 | Discharge: 2017-08-25 | Disposition: A | Discharge status: Home / Self Care | Payer: Federal, State, Local not specified - PPO | Source: Ambulatory Visit | Attending: Orthopaedic Surgery | Admitting: Orthopaedic Surgery

## 2017-08-25 DIAGNOSIS — G8929 Other chronic pain: Secondary | ICD-10-CM | POA: Diagnosis not present

## 2017-08-25 DIAGNOSIS — M25561 Pain in right knee: Secondary | ICD-10-CM | POA: Insufficient documentation

## 2017-08-25 DIAGNOSIS — S83231A Complex tear of medial meniscus, current injury, right knee, initial encounter: Secondary | ICD-10-CM | POA: Diagnosis not present

## 2017-08-25 DIAGNOSIS — M7121 Synovial cyst of popliteal space [Baker], right knee: Secondary | ICD-10-CM | POA: Insufficient documentation

## 2017-08-25 DIAGNOSIS — M25461 Effusion, right knee: Secondary | ICD-10-CM | POA: Insufficient documentation

## 2017-08-25 DIAGNOSIS — M948X6 Other specified disorders of cartilage, lower leg: Secondary | ICD-10-CM | POA: Diagnosis not present

## 2017-08-25 DIAGNOSIS — X58XXXA Exposure to other specified factors, initial encounter: Secondary | ICD-10-CM | POA: Insufficient documentation

## 2017-08-31 ENCOUNTER — Encounter: Payer: Self-pay | Admitting: Orthopaedic Surgery

## 2017-08-31 ENCOUNTER — Ambulatory Visit (INDEPENDENT_AMBULATORY_CARE_PROVIDER_SITE_OTHER): Payer: Federal, State, Local not specified - PPO | Admitting: Orthopaedic Surgery

## 2017-08-31 VITALS — BP 129/73 | HR 76 | Ht 73.0 in | Wt 200.0 lb

## 2017-08-31 DIAGNOSIS — M25561 Pain in right knee: Secondary | ICD-10-CM

## 2017-08-31 DIAGNOSIS — G8929 Other chronic pain: Secondary | ICD-10-CM

## 2017-08-31 NOTE — Progress Notes (Signed)
Patient Joe Jennings, male DOB:[birth year], 62 y.o. WUJ:[id number]  Chief Complaint  Patient presents with  . Knee Pain    right    HPI  Lantz Hermann is a 62 y.o. male who has continued pain of the right knee. He has swelling and giving way.  MRI of the knee was done and it showed: IMPRESSION: 1. Extensive complex tear involving the entire medial meniscus. 2. Diffuse cartilage loss in the medial compartment. 3. Moderate joint effusion with complex prominent Baker's cyst.  I have explained the findings to him.  I have recommended arthroscopy of the right knee.  I will have him see Dr. Romeo Apple.  He is agreeable.   Body mass index is 26.39 kg/m.  ROS  Review of Systems  Constitutional: Positive for activity change.  Respiratory: Negative for cough and shortness of breath.   Cardiovascular: Negative for chest pain and leg swelling.  Musculoskeletal: Positive for arthralgias, gait problem and joint swelling.  Neurological: Positive for headaches.  All other systems reviewed and are negative.   All other systems reviewed and are negative.  Past Medical History:  Diagnosis Date  . Arthritis   . Chronic pain   . Depression   . GERD (gastroesophageal reflux disease)   . Headache   . Neuromuscular disorder [facility])     Past Surgical History:  Procedure Laterality Date  . ABDOMINAL SURGERY     vericaseal per patient history   . acromial plasty Left 1993  . RESECTION DISTAL CLAVICAL  1993  . ROTATOR CUFF REPAIR Right 1996  . SHOULDER SURGERY Bilateral     Family History  Problem Relation Age of Onset  . Bipolar disorder Mother   . Kidney disease Mother   . Bipolar disorder Sister   . Bipolar disorder Sister   . High blood pressure Father   . Diabetes Father     Social History Social History   Tobacco Use  . Smoking status: Never Smoker  . Smokeless tobacco: Never Used  Substance Use Topics  . Alcohol use: No  . Drug use: No    No Known  Allergies  Current Outpatient Medications  Medication Sig Dispense Refill  . buPROPion (WELLBUTRIN XL) 300 MG 24 hr tablet TAKE 1 TABLET(300 MG) BY MOUTH DAILY 90 tablet 2  . Celecoxib (CELEBREX PO) Take by mouth 2 (two) times daily.    . Cyclobenzaprine HCl (FLEXERIL PO) Take by mouth 2 (two) times daily.    . fenofibrate (TRICOR) 145 MG tablet Take 145 mg by mouth daily.    . Multiple Vitamins-Minerals (MULTIVITAMIN WITH MINERALS) tablet Take 1 tablet by mouth daily. For nutritional supplementation 30 tablet   . Omega-3 Fatty Acids (FISH OIL CONCENTRATE) 300 MG CAPS Take 300 mg by mouth daily.    Marland Kitchen oxyCODONE (OXYCONTIN) 60 MG 12 hr tablet Take by mouth every 12 (twelve) hours.    Marland Kitchen oxycodone (ROXICODONE) 30 MG immediate release tablet Take 30 mg by mouth every 4 (four) hours as needed for pain.    . Pantoprazole Sodium (PROTONIX PO) Take by mouth daily.    . polyethylene glycol (MIRALAX / GLYCOLAX) packet Take 17 g by mouth daily.    Marland Kitchen testosterone cypionate (DEPOTESTOSTERONE CYPIONATE) 200 MG/ML injection     . Vitamin D, Ergocalciferol, (DRISDOL) 50000 units CAPS capsule Take 50,000 Units by mouth every 7 (seven) days.     No current facility-administered medications for this visit.      Physical Exam  Blood pressure 129/73, pulse 76,  height 6\' 1"  (1.854 m), weight 200 lb (90.7 kg).  Constitutional: overall normal hygiene, normal nutrition, well developed, normal grooming, normal body habitus. Assistive device:none  Musculoskeletal: gait and station Limp right, muscle tone and strength are normal, no tremors or atrophy is present.  .  Neurological: coordination overall normal.  Deep tendon reflex/nerve stretch intact.  Sensation normal.  Cranial nerves II-XII intact.   Skin:   Normal overall no scars, lesions, ulcers or rashes. No psoriasis.  Psychiatric: Alert and oriented x 3.  Recent memory intact, remote memory unclear.  Normal mood and affect. Well groomed.  Good eye  contact.  Cardiovascular: overall no swelling, no varicosities, no edema bilaterally, normal temperatures of the legs and arms, no clubbing, cyanosis and good capillary refill.  Lymphatic: palpation is normal.  Right knee with effusion, ROM 0 to 110, positive medial McMurray, limp right.  All other systems reviewed and are negative   The patient has been educated about the nature of the problem(s) and counseled on treatment options.  The patient appeared to understand what I have discussed and is in agreement with it.  Encounter Diagnosis  Name Primary?  . Chronic pain of right knee Yes    PLAN Call if any problems.  Precautions discussed.  Continue current medications.   Return to clinic to see Dr. Romeo AppleHarrison, possible arthroscopy.   Electronically Signed Darreld McleanWayne Tywanda Rice, MD 7/9/20191:38 PM

## 2017-09-03 ENCOUNTER — Ambulatory Visit (INDEPENDENT_AMBULATORY_CARE_PROVIDER_SITE_OTHER): Payer: Federal, State, Local not specified - PPO | Admitting: Orthopedic Surgery

## 2017-09-03 ENCOUNTER — Encounter: Payer: Self-pay | Admitting: Orthopedic Surgery

## 2017-09-03 VITALS — BP 132/76 | Ht 73.0 in | Wt 195.0 lb

## 2017-09-03 DIAGNOSIS — G894 Chronic pain syndrome: Secondary | ICD-10-CM

## 2017-09-03 DIAGNOSIS — M25561 Pain in right knee: Secondary | ICD-10-CM

## 2017-09-03 DIAGNOSIS — G8929 Other chronic pain: Secondary | ICD-10-CM | POA: Diagnosis not present

## 2017-09-03 DIAGNOSIS — M23321 Other meniscus derangements, posterior horn of medial meniscus, right knee: Secondary | ICD-10-CM | POA: Diagnosis not present

## 2017-09-03 NOTE — Progress Notes (Signed)
PREOP CONSULT/REFERRAL INTRA-OFFICE FROM DR Gaylene Brooks   Chief Complaint  Patient presents with  . Knee Pain    surgical consult per Dr Hilda Lias right knee     MEDICAL DECISION SECTION   Counseling was the primary determining factor for this visit for preop evaluation which involved 15 minutes of direct contact with the patient    Risk Indicators  NARX SCORES  Narcotic  492  Sedative  280  Stimulant  000  Explanation and Guidance  OVERDOSE RISK SCORE  490  (Range 000-999)  Explanation and Guidance  ADDITIONAL RISK INDICATORS ( 2 )  >= 4 opioid or sedative dispensing pharmacies in any 90 day period in the last 2 years  > 100 MME total and 40 MME/day average  Explanation and Guidance    08/31/2017 1 08/30/2017 Roxicodone 30 Mg Tablet  180 30 [facility] [id number] [facility] ([facility]61[facility] 0 270.00 [place] [place] 08/27/2017 2 07/20/2017 Oxycontin Er 60 Mg Tablet  60 30 [facility] [id number] [facility] (0299) 0 180.00 [place] 07/29/2017 1 07/29/2017 Roxicodone 30 Mg Tablet  180 30 [facility] [id number] [facility] ([facility]61[facility] 0 270.[place] 07/29/2017 1 07/20/2017 Oxycontin Er 60 Mg Tablet  60 30 Ka M[facility] [id number] [facility] (8459) 0 180.[place] 07/28/2017 1 07/02/2017 Testosterone Cyp 200 Mg/ml  2 28 Ca Sha [id number] [facility] (8459) 1 [place] 07/09/2017 1 05/07/2017 Oxycontin Er 40 Mg Tablet  60 30 [facility] [id number] [facility] (2016) 0 120.00 [place] 07/02/2017 1 07/02/2017 Testosterone Cyp 200 Mg/ml  2 28 Ca Sha [id number] [facility] (8459) [place] 07/02/2017 1 06/29/2017 Roxicodone 30 Mg Tablet  180 30 [facility] [id number] [facility] (0061) 0 270.00 [place]   xrays ordered? No   Reports of imaging:  Clinical:  Right knee pain, medially tenderness, no trauma   X-rays were done of the right knee, three views.   There is medial narrowing of the right knee with mild degenerative changes present.  No fracture or loose body is noted.  Bone quality is good.   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   Electronically Signed Darreld Mclean, MD 6/27/20192:12 PM   CLINICAL DATA:  Right knee pain for 2-3 months.   EXAM: MRI OF THE RIGHT KNEE WITHOUT CONTRAST   TECHNIQUE: Multiplanar, multisequence MR imaging of the knee was performed. No intravenous contrast was administered.   COMPARISON:  Radiographs dated 08/19/2017   FINDINGS: MENISCI   Medial meniscus: There is a complex tear of the entire medial meniscus with a horizontal component in the posterior horn, avulsion of midbody, which appears to be flipped above the posterior horn, and peripheral extrusion and tearing of the anterior horn. The extruded meniscus is subluxed into the inferior gutter best seen on series 8.   Lateral meniscus:  Normal.   LIGAMENTS   Cruciates:  Normal.   Collaterals:  Normal.   CARTILAGE   Patellofemoral: Slight thinning of the articular cartilage of the lateral facet of the patella.   Medial: Extensive full-thickness cartilage loss of the femoral condyle and tibial plateau.   Lateral:  Normal.   Joint:  Moderate joint effusion.  No plical thickening.   Popliteal Fossa: 12 x 2.5 x 2.0 cm multilobulated Baker's cyst which extends 9 cm proximal to the knee joint. Intact popliteus tendon.   Extensor Mechanism:  Normal.   Bones: Small marginal osteophytes in the medial  and lateral compartments.   Other: None   IMPRESSION: 1. Extensive complex tear involving the entire medial meniscus. 2. Diffuse cartilage loss in the medial Jennings. 3. Moderate joint effusion with complex prominent Baker's cyst.     Electronically Signed   By: Francene BoyersJames  Maxwell M.D.   On: 08/25/2017 11:07   My independent reading of xrays: MRI does indeed show a torn medial meniscus and Joe Jennings is indeed destroyed.  Joe Jennings also has a Baker's cyst.  Joe Jennings show tibiofemoral alignment still in valgus surprisingly small amount of medial Jennings still  preserved there is joint space narrowing on both sides of the joint and there is not a lot of osteophyte formation  Encounter Diagnosis  Name Primary?  . Chronic pain of right knee Yes     PLAN:   Joe Jennings presents several difficult problems in Joe Jennings.  The patient was given 2 options 1 for total knee replacement or second option of knee arthroscopy with risk of further pain secondary to grade 4 chondral changes on the medial Jennings which matches symptoms of stiffness pain getting out of a chair difficulty getting up and clicking and popping in the right knee  Joe Jennings also has an issue with chronic pain as Joe Jennings has had a [facility] prescribes oxycodone 60 mg every 12 and then Roxicodone 30 mg up to 6 a day as needed   No orders of the defined types were placed in this encounter.      Chief Complaint  Patient presents with  . Knee Pain    surgical consult per Dr Hilda LiasKeeling right knee     HPI as dictated by Dr. Hilda LiasKeeling patient's history is as follows   Joe Jennings has had pain and tenderness of the right knee for several months now.  It has gradually started and is getting worse and worse.  Joe Jennings has popping and swelling and medial pain.  Joe Jennings has no giving way.  Joe Jennings has more pain when first standing on it from lying down at night or sitting in a chair a while.  Joe Jennings has no trauma, no redness, no numbness.  Joe Jennings has been on significant pain medicine for chronic pain syndrome with neck and back pain.  Joe Jennings is disabled secondary to the chronic pain syndrome.  The knee pain is new over the last several months.   Joe Jennings says the pain medicine has not helped Joe Jennings knee.   Joe Jennings complains of a loud popping noise if Joe Jennings knee is been stiff or sitting for long period of time   Where when Joe Jennings straightens it it pops Joe Jennings has difficulty getting out of a chair ,   Review of Systems  Constitutional: Positive for activity change.  Respiratory: Negative  for cough and shortness of breath.   Cardiovascular: Negative for chest pain and leg swelling.  Musculoskeletal: Positive for arthralgias, gait problem and joint swelling.  Neurological: Positive for headaches.  All other systems reviewed and are negative.      Past Medical History:  Diagnosis Date  . Arthritis   . Chronic pain   . Deep vein thrombosis (HCC) 2003   after left knee surgery  . Depression   . GERD (gastroesophageal reflux disease)   . Headache   . Neuromuscular disorder [facility])     Past Surgical History:  Procedure Laterality Date  . ABDOMINAL SURGERY     vericaseal per patient history   .  acromial plasty Left 1993  . RESECTION DISTAL CLAVICAL  1993  . ROTATOR CUFF REPAIR Right 1996  . SHOULDER SURGERY Bilateral     Family History  Problem Relation Age of Onset  . Bipolar disorder Mother   . Kidney disease Mother   . Bipolar disorder Sister   . Bipolar disorder Sister   . High blood pressure Father   . Diabetes Father    Social History   Tobacco Use  . Smoking status: Never Smoker  . Smokeless tobacco: Never Used  Substance Use Topics  . Alcohol use: No  . Drug use: No    No Known Allergies   Current Meds  Medication Sig  . buPROPion (WELLBUTRIN XL) 300 MG 24 hr tablet TAKE 1 TABLET(300 MG) BY MOUTH DAILY  . Celecoxib (CELEBREX PO) Take by mouth 2 (two) times daily.  . fenofibrate (TRICOR) 145 MG tablet Take 145 mg by mouth daily.  . Multiple Vitamins-Minerals (MULTIVITAMIN WITH MINERALS) tablet Take 1 tablet by mouth daily. For nutritional supplementation  . Omega-3 Fatty Acids (FISH OIL CONCENTRATE) 300 MG CAPS Take 300 mg by mouth daily.  Marland Kitchen oxyCODONE (OXYCONTIN) 60 MG 12 hr tablet Take by mouth every 12 (twelve) hours.  Marland Kitchen oxycodone (ROXICODONE) 30 MG immediate release tablet Take 30 mg by mouth every 4 (four) hours as needed for pain.  . Pantoprazole Sodium (PROTONIX PO) Take by mouth daily.  . polyethylene glycol (MIRALAX / GLYCOLAX)  packet Take 17 g by mouth daily.  Marland Kitchen testosterone cypionate (DEPOTESTOSTERONE CYPIONATE) 200 MG/ML injection   . Vitamin D, Ergocalciferol, (DRISDOL) 50000 units CAPS capsule Take 50,000 Units by mouth every 7 (seven) days.    BP 132/76   Ht 6\' 1"  (1.854 m)   Wt 195 lb (88.5 kg)   BMI 25.73 kg/m         Fuller Canada, MD 09/03/2017 12:29 PM

## 2017-09-03 NOTE — Patient Instructions (Signed)
Knee Arthroscopy Knee arthroscopy is a surgical procedure that is used to examine the inside of your knee joint and repair any damage. The surgeon puts a small, lighted instrument with a camera on the tip (arthroscope) through a small incision in your knee. The camera sends pictures to a monitor in the operating room. Your surgeon uses those pictures to guide the surgical instruments through other incisions to the area of damage. Knee arthroscopy can be used to treat many types of knee problems. It may be used:  To repair a torn ligament.  To repair or remove damaged tissue.  To remove a fluid-filled sac (cyst) from your knee.  Tell a health care provider about:  Any allergies you have.  All medicines you are taking, including vitamins, herbs, eye drops, creams, and over-the-counter medicines.  Any problems you or family members have had with anesthetic medicines.  Any blood disorders you have.  Any surgeries you have had.  Any medical conditions you have. What are the risks? Generally, this is a safe procedure. However, problems may occur, including:  Infection.  Bleeding.  Damage to blood vessels, nerves, or structures of your knee.  A blood clot that forms in your leg and travels to your lung.  Failure to relieve symptoms.  What happens before the procedure?  Ask your health care provider about: ? Changing or stopping your regular medicines. This is especially important if you are taking diabetes medicines or blood thinners. ? Taking medicines such as aspirin and ibuprofen. These medicines can thin your blood. Do not take these medicines before your procedure if your health care provider instructs you not to.  Follow your health care provider's instructions about eating or drinking restrictions.  Plan to have someone take you home after the procedure.  If you go home right after the procedure, plan to have someone with you for 24 hours.  Do not drink alcohol unless  your health care provider says that you can.  Do not use any tobacco products, including cigarettes, chewing tobacco, or electronic cigarettes unless your health care provider says that you can. If you need help quitting, ask your health care provider.  You may have a physical exam. What happens during the procedure?  An IV tube will be inserted into one of your veins.  You will be given one or more of the following: ? A medicine that helps you relax (sedative). ? A medicine that numbs the area (local anesthetic). ? A medicine that makes you fall asleep (general anesthetic). ? A medicine that is injected into your spine that numbs the area below and slightly above the injection site (spinal anesthetic). ? A medicine that is injected into an area of your body that numbs everything below the injection site (regional anesthetic).  A cuff may be placed around your upper leg to slow bleeding during the procedure.  The surgeon will make a small number of incisions around your knee.  Your knee joint will be flushed and filled with a germ-free (sterile) solution.  The arthroscope will be passed through an incision into your knee joint.  More instruments will be passed through other incisions to repair your knee as needed.  The fluid will be removed from your knee.  The incisions will be closed with adhesive strips or stitches (sutures).  A bandage (dressing) will be placed over your knee. The procedure may vary among health care providers and hospitals. What happens after the procedure?  Your blood pressure, heart rate, breathing   rate and blood oxygen level will be monitored often until the medicines you were given have worn off.  You may be given medicine for pain.  You may get crutches to help you walk without using your knee to support your body weight.  You may have to wear compression stockings. These stocking help to prevent blood clots and reduce swelling in your legs. This  information is not intended to replace advice given to you by your health care provider. Make sure you discuss any questions you have with your health care provider. Document Released: 02/07/2000 Document Revised: 07/18/2015 Document Reviewed: 02/05/2014 Elsevier Interactive Patient Education  2018 [employer]  

## 2017-09-07 ENCOUNTER — Telehealth: Payer: Self-pay | Admitting: Orthopedic Surgery

## 2017-09-07 NOTE — Telephone Encounter (Signed)
Patient returned call. States will come by our office in the next 1 to 2 days to sign release for medical records, as [facility] Friday, [facility].

## 2017-09-07 NOTE — Telephone Encounter (Signed)
Patient called and had left voice message in regard to requesting his medical records - I returned call and left voice message at his phone #[phone], to return call to discuss process of records requests.

## 2017-09-30 ENCOUNTER — Other Ambulatory Visit (HOSPITAL_COMMUNITY): Payer: Self-pay | Admitting: Psychiatry

## 2017-11-15 ENCOUNTER — Ambulatory Visit (HOSPITAL_COMMUNITY): Payer: Self-pay | Admitting: Psychiatry

## 2017-11-25 ENCOUNTER — Encounter (HOSPITAL_COMMUNITY): Payer: Self-pay | Admitting: Psychiatry

## 2017-11-25 ENCOUNTER — Ambulatory Visit (INDEPENDENT_AMBULATORY_CARE_PROVIDER_SITE_OTHER): Payer: Federal, State, Local not specified - PPO | Admitting: Psychiatry

## 2017-11-25 VITALS — BP 152/80 | HR 79 | Ht 73.0 in | Wt 201.0 lb

## 2017-11-25 DIAGNOSIS — F419 Anxiety disorder, unspecified: Secondary | ICD-10-CM

## 2017-11-25 DIAGNOSIS — F322 Major depressive disorder, single episode, severe without psychotic features: Secondary | ICD-10-CM

## 2017-11-25 MED ORDER — BUSPIRONE HCL 10 MG PO TABS: 10.0000 mg | ORAL_TABLET | Freq: Three times a day (TID) | ORAL | 2 refills | Status: DC

## 2017-11-25 MED ORDER — BUPROPION HCL ER (XL) 300 MG PO TB24: ORAL_TABLET | ORAL | 2 refills | Status: DC
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
## 2017-12-01 ENCOUNTER — Other Ambulatory Visit (HOSPITAL_COMMUNITY): Payer: Self-pay | Admitting: Psychiatry

## 2017-12-01 ENCOUNTER — Telehealth (HOSPITAL_COMMUNITY): Payer: Self-pay | Admitting: *Deleted

## 2017-12-01 MED ORDER — BUSPIRONE HCL 15 MG PO TABS: 15.0000 mg | ORAL_TABLET | Freq: Three times a day (TID) | ORAL | 2 refills | Status: DC

## 2017-12-01 NOTE — Telephone Encounter (Signed)
I have sent in a higher dose. It helps anxiety but [probably not sleep

## 2017-12-01 NOTE — Telephone Encounter (Signed)
Dr Tenny Craw Patient called stating that you asked for a call back on how medication is working.  And patient states med 10 mg Buspar isn't working for his anxiety or helping with sleep

## 2018-02-28 ENCOUNTER — Ambulatory Visit (HOSPITAL_COMMUNITY): Payer: Federal, State, Local not specified - PPO | Admitting: Psychiatry

## 2018-03-21 ENCOUNTER — Ambulatory Visit (HOSPITAL_COMMUNITY): Payer: Federal, State, Local not specified - PPO | Admitting: Psychiatry

## 2018-03-21 ENCOUNTER — Encounter (HOSPITAL_COMMUNITY): Payer: Self-pay | Admitting: Psychiatry

## 2018-03-21 VITALS — BP 130/82 | HR 82 | Ht 73.0 in | Wt 200.0 lb

## 2018-03-21 DIAGNOSIS — F322 Major depressive disorder, single episode, severe without psychotic features: Secondary | ICD-10-CM

## 2018-03-21 MED ORDER — BUPROPION HCL ER (XL) 300 MG PO TB24: ORAL_TABLET | ORAL | 2 refills | Status: DC
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
## 2018-07-20 ENCOUNTER — Ambulatory Visit (HOSPITAL_COMMUNITY): Payer: Federal, State, Local not specified - PPO | Admitting: Psychiatry

## 2018-08-23 ENCOUNTER — Other Ambulatory Visit: Payer: Self-pay

## 2018-08-23 ENCOUNTER — Encounter (HOSPITAL_COMMUNITY): Payer: Self-pay | Admitting: Psychiatry

## 2018-08-23 ENCOUNTER — Ambulatory Visit (INDEPENDENT_AMBULATORY_CARE_PROVIDER_SITE_OTHER): Payer: Federal, State, Local not specified - PPO | Admitting: Psychiatry

## 2018-08-23 DIAGNOSIS — F322 Major depressive disorder, single episode, severe without psychotic features: Secondary | ICD-10-CM | POA: Diagnosis not present

## 2018-08-23 MED ORDER — BUPROPION HCL ER (XL) 300 MG PO TB24: ORAL_TABLET | ORAL | 2 refills | Status: DC
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
## 2018-10-27 IMAGING — MR MR KNEE*R* W/O CM
4 of 6 series · 20 of 40 positions shown · non-contrast
Comparison: Radiographs dated 08/19/2017

CLINICAL DATA: Right knee pain for 2-3 months.

EXAM:
MRI OF THE RIGHT KNEE WITHOUT CONTRAST
TECHNIQUE: Multiplanar, multisequence MR imaging of the knee was performed. No
intravenous contrast was administered.

[Series 4: t2fs axial · axial · 4.0mm · 0.46mm/px · z∈[-82,+33]mm · 5 of 24 slices shown]
[im 1/24]
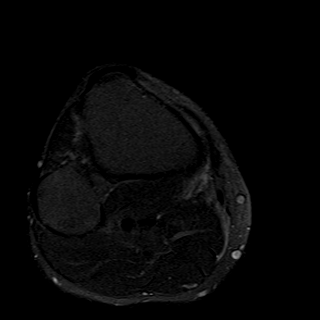
[im 6/24]
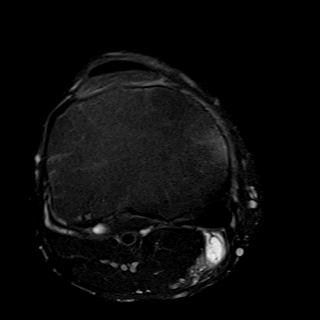
[im 12/24]
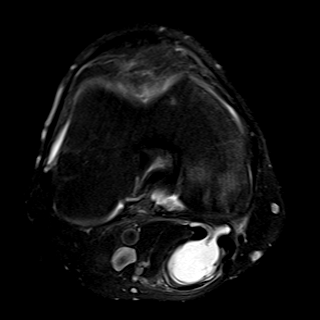
[im 18/24]
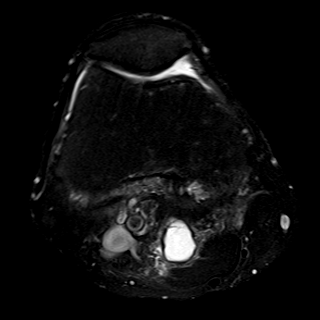
[im 24/24]
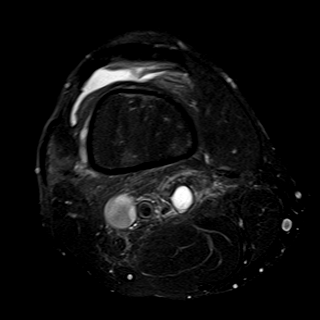

[Series 5: T1 · coronal · 4.0mm · 0.29mm/px · 8 of 30 slices shown]
[im 1/30]
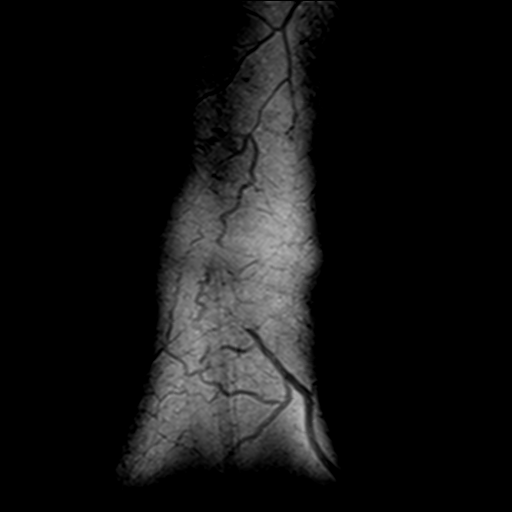
[im 5/30]
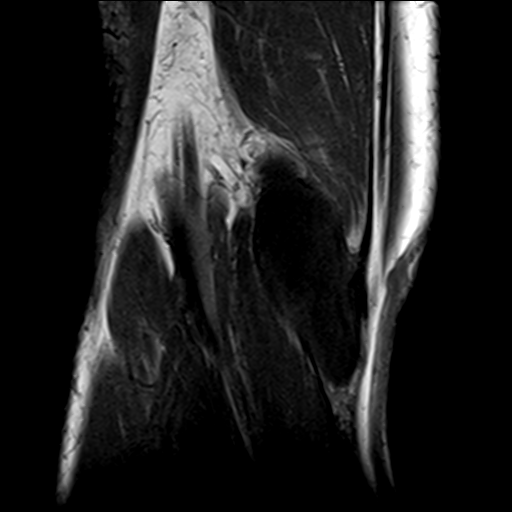
[im 9/30]
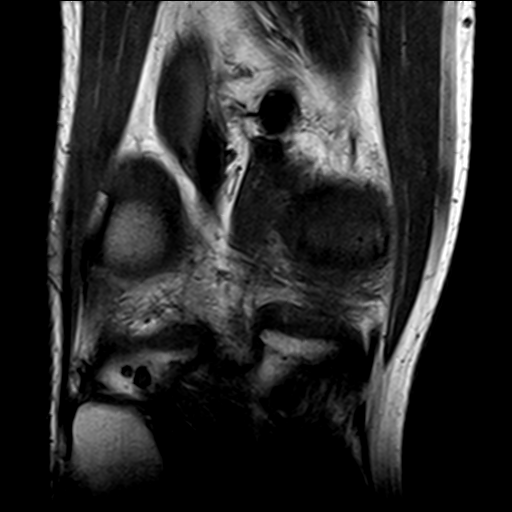
[im 13/30]
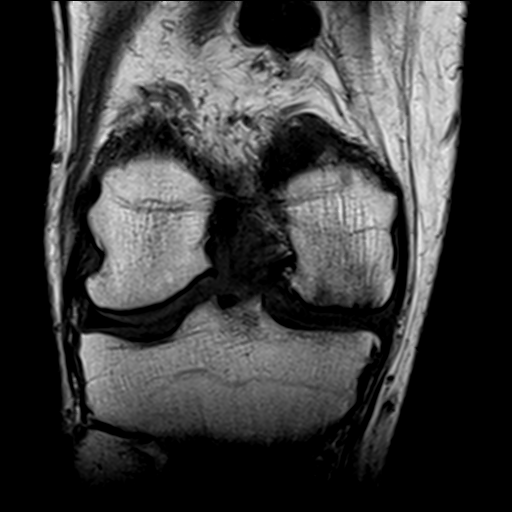
[im 17/30]
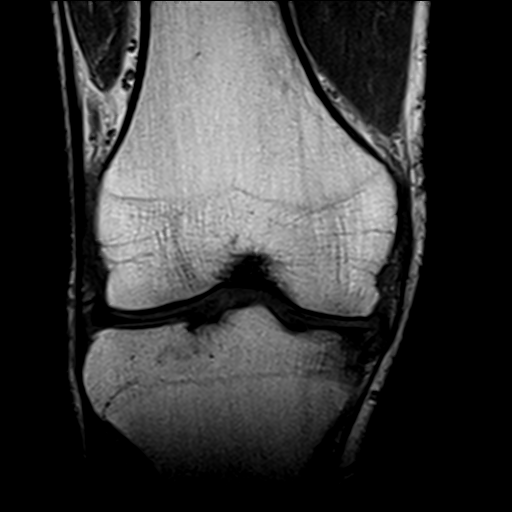
[im 21/30]
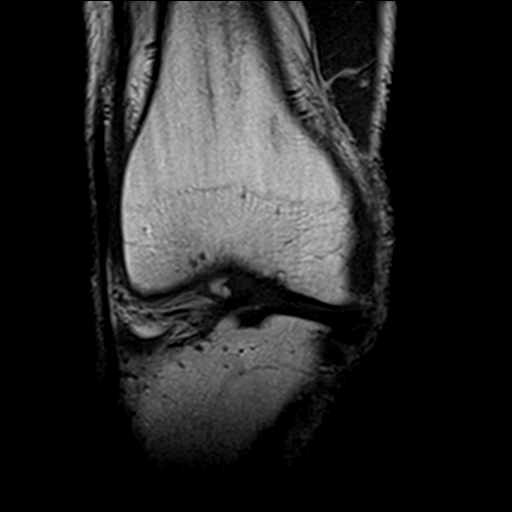
[im 25/30]
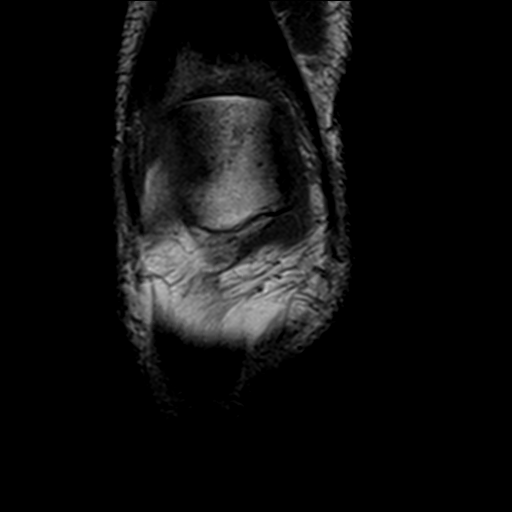
[im 30/30]
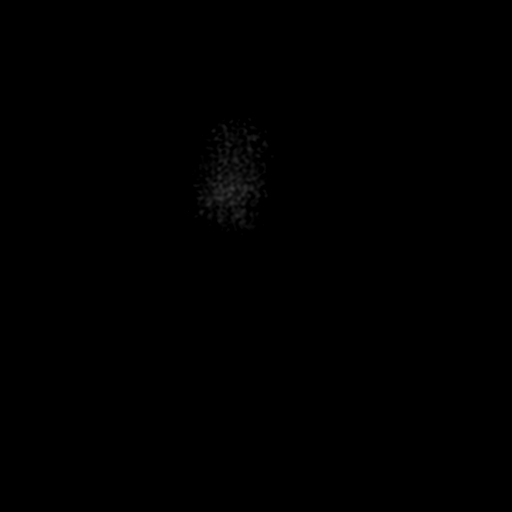

[Series 6: pdfs sag · sagittal · 3.0mm · 0.23mm/px · 4 of 29 slices shown]
[im 1/29]
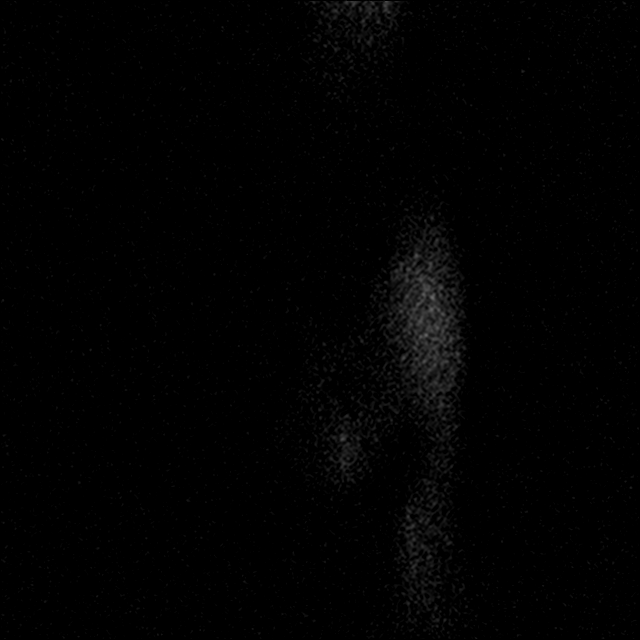
[im 5/29]
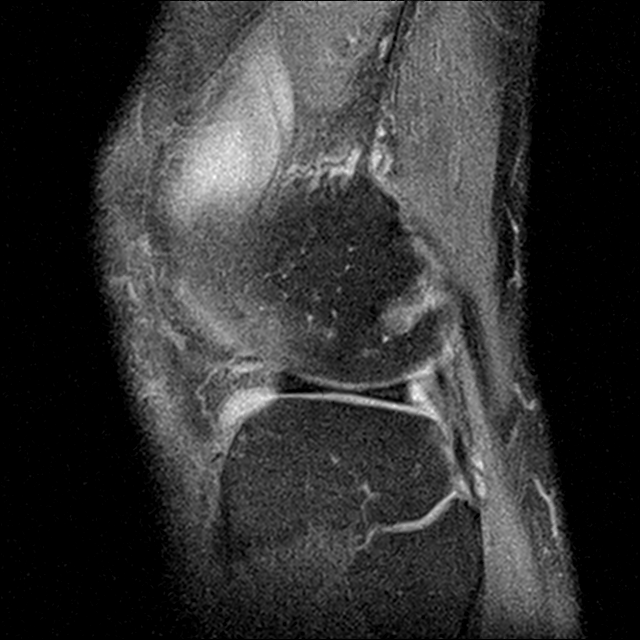
[im 15/29]
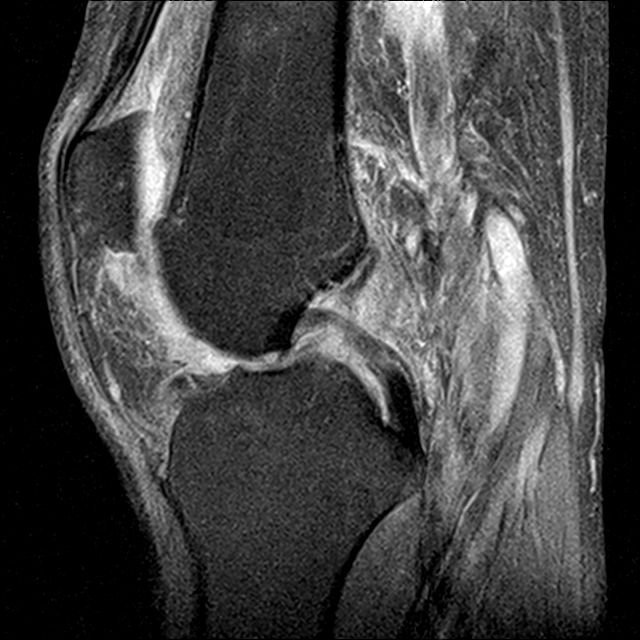
[im 24/29]
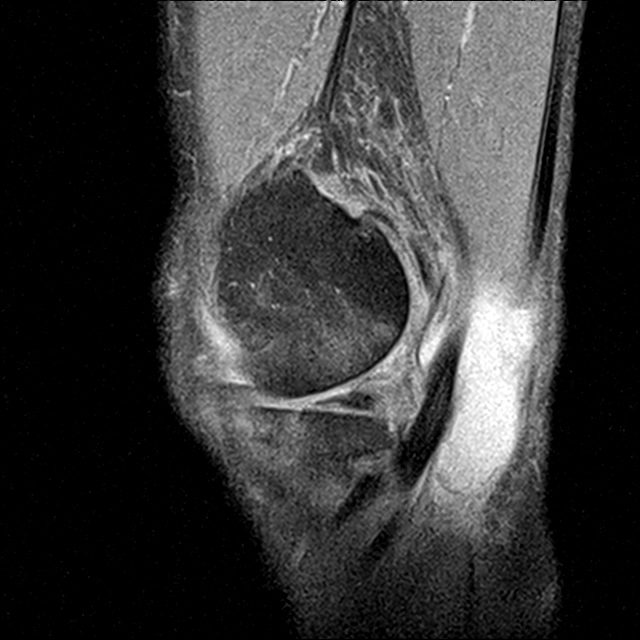

[Series 7: t2fs cor · coronal · 4.0mm · 0.31mm/px · 3 of 30 slices shown]
[im 5/30]
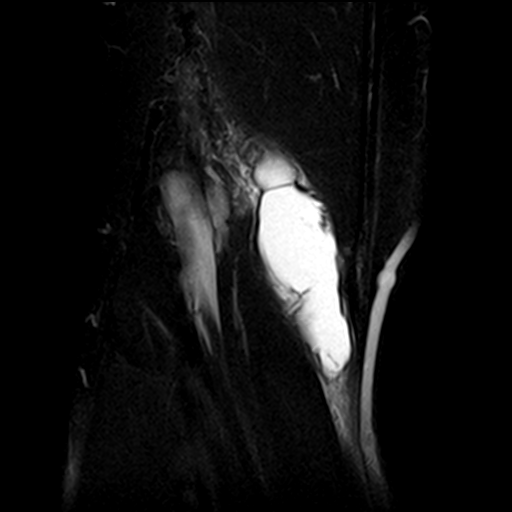
[im 17/30]
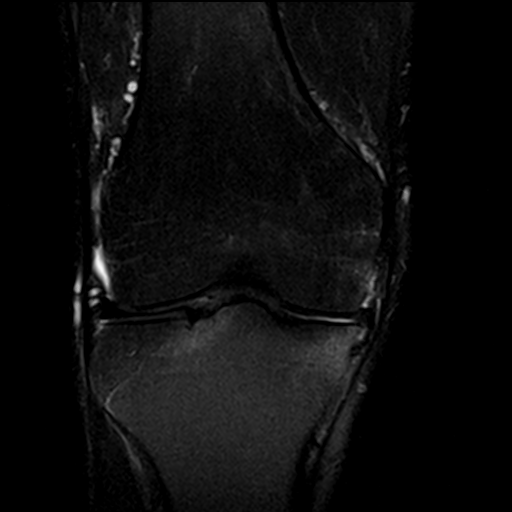
[im 25/30]
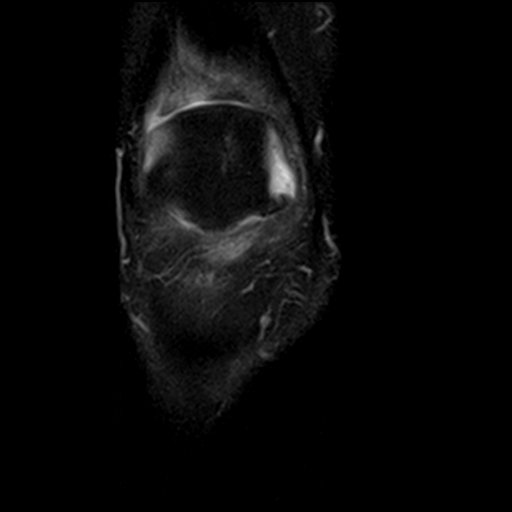

[20 of 40 positions shown; findings below may reference images not displayed]

FINDINGS: MENISCI

Medial meniscus: There is a complex tear of the entire medial
meniscus with a horizontal component in the posterior horn, avulsion
of midbody, which appears to be flipped above the posterior horn,
and peripheral extrusion and tearing of the anterior horn. The
extruded meniscus is subluxed into the inferior gutter best seen on
series 8.

Lateral meniscus:  Normal.

LIGAMENTS

Cruciates:  Normal.

Collaterals:  Normal.

CARTILAGE

Patellofemoral: Slight thinning of the articular cartilage of the
lateral facet of the patella.

Medial: Extensive full-thickness cartilage loss of the femoral
condyle and tibial plateau.

Lateral:  Normal.

Joint:  Moderate joint effusion.  No plical thickening.

Popliteal Fossa: 12 x 2.5 x 2.0 cm multilobulated Baker's cyst which
extends 9 cm proximal to the knee joint. Intact popliteus tendon.

Extensor Mechanism:  Normal.

Bones: Small marginal osteophytes in the medial and lateral
compartments.

Other: None
IMPRESSION: 1. Extensive complex tear involving the entire medial meniscus.
2. Diffuse cartilage loss in the medial compartment.
3. Moderate joint effusion with complex prominent Baker's cyst.

## 2018-11-24 ENCOUNTER — Other Ambulatory Visit: Payer: Self-pay

## 2018-11-24 ENCOUNTER — Ambulatory Visit (INDEPENDENT_AMBULATORY_CARE_PROVIDER_SITE_OTHER): Payer: Federal, State, Local not specified - PPO | Admitting: Psychiatry

## 2018-11-24 ENCOUNTER — Encounter (HOSPITAL_COMMUNITY): Payer: Self-pay | Admitting: Psychiatry

## 2018-11-24 DIAGNOSIS — F322 Major depressive disorder, single episode, severe without psychotic features: Secondary | ICD-10-CM | POA: Diagnosis not present

## 2018-11-24 MED ORDER — BUPROPION HCL ER (XL) 300 MG PO TB24: ORAL_TABLET | ORAL | 2 refills | Status: DC
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
## 2019-02-27 ENCOUNTER — Encounter (HOSPITAL_COMMUNITY): Payer: Self-pay | Admitting: Psychiatry

## 2019-02-27 ENCOUNTER — Other Ambulatory Visit: Payer: Self-pay

## 2019-02-27 ENCOUNTER — Ambulatory Visit (INDEPENDENT_AMBULATORY_CARE_PROVIDER_SITE_OTHER): Payer: Federal, State, Local not specified - PPO | Admitting: Psychiatry

## 2019-02-27 DIAGNOSIS — F322 Major depressive disorder, single episode, severe without psychotic features: Secondary | ICD-10-CM | POA: Diagnosis not present

## 2019-02-27 MED ORDER — AMITRIPTYLINE HCL 25 MG PO TABS: 25.0000 mg | ORAL_TABLET | Freq: Every day | ORAL | 2 refills | Status: DC

## 2019-02-27 MED ORDER — BUPROPION HCL ER (XL) 300 MG PO TB24: ORAL_TABLET | ORAL | 2 refills | Status: DC
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
## 2019-03-28 ENCOUNTER — Ambulatory Visit (HOSPITAL_COMMUNITY): Payer: Federal, State, Local not specified - PPO | Admitting: Psychiatry

## 2019-04-21 ENCOUNTER — Ambulatory Visit (INDEPENDENT_AMBULATORY_CARE_PROVIDER_SITE_OTHER): Payer: Federal, State, Local not specified - PPO | Admitting: Psychiatry

## 2019-04-21 ENCOUNTER — Other Ambulatory Visit: Payer: Self-pay

## 2019-04-21 ENCOUNTER — Encounter (HOSPITAL_COMMUNITY): Payer: Self-pay | Admitting: Psychiatry

## 2019-04-21 DIAGNOSIS — F322 Major depressive disorder, single episode, severe without psychotic features: Secondary | ICD-10-CM | POA: Diagnosis not present

## 2019-04-21 MED ORDER — BUPROPION HCL ER (XL) 300 MG PO TB24: ORAL_TABLET | ORAL | 2 refills | Status: DC
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
## 2019-05-03 ENCOUNTER — Ambulatory Visit (INDEPENDENT_AMBULATORY_CARE_PROVIDER_SITE_OTHER): Payer: Federal, State, Local not specified - PPO | Admitting: Psychiatry

## 2019-05-03 ENCOUNTER — Encounter (HOSPITAL_COMMUNITY): Payer: Self-pay | Admitting: Psychiatry

## 2019-05-03 ENCOUNTER — Other Ambulatory Visit: Payer: Self-pay

## 2019-05-03 DIAGNOSIS — F322 Major depressive disorder, single episode, severe without psychotic features: Secondary | ICD-10-CM | POA: Diagnosis not present

## 2019-05-03 MED ORDER — DOXEPIN HCL 10 MG PO CAPS: 10.0000 mg | ORAL_CAPSULE | Freq: Every day | ORAL | 2 refills | Status: DC
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
## 2019-05-15 ENCOUNTER — Telehealth (HOSPITAL_COMMUNITY): Payer: Self-pay | Admitting: Psychiatry

## 2019-05-15 NOTE — Telephone Encounter (Signed)
Patient advised medication given for sleep and anxiety is not working did the trial for low dosage requesting to have medication increased.

## 2019-05-17 ENCOUNTER — Telehealth (HOSPITAL_COMMUNITY): Payer: Self-pay | Admitting: *Deleted

## 2019-05-17 ENCOUNTER — Other Ambulatory Visit (HOSPITAL_COMMUNITY): Payer: Self-pay | Admitting: Psychiatry

## 2019-05-17 MED ORDER — DOXEPIN HCL 25 MG PO CAPS: 25.0000 mg | ORAL_CAPSULE | Freq: Every day | ORAL | 2 refills | Status: DC

## 2019-05-17 NOTE — Telephone Encounter (Signed)
Patient called with concerns of doxepin (SINEQUAN) 10 MG capsule not sleeping @ all & 0 appetite. Patient stated he was taken off the pain medication & asked if maybe he could have a increase or change??

## 2019-05-17 NOTE — Telephone Encounter (Signed)
Doxepin increased to 25 mg

## 2019-05-17 NOTE — Telephone Encounter (Signed)
LVM PER PROVIDER: Doxepin increased to 25 mg

## 2019-05-26 ENCOUNTER — Encounter (HOSPITAL_COMMUNITY): Payer: Self-pay | Admitting: Psychiatry

## 2019-05-26 ENCOUNTER — Other Ambulatory Visit: Payer: Self-pay

## 2019-05-26 ENCOUNTER — Ambulatory Visit (INDEPENDENT_AMBULATORY_CARE_PROVIDER_SITE_OTHER): Payer: Medicare Other | Admitting: Psychiatry

## 2019-05-26 DIAGNOSIS — F322 Major depressive disorder, single episode, severe without psychotic features: Secondary | ICD-10-CM

## 2019-05-26 MED ORDER — BUPROPION HCL ER (XL) 300 MG PO TB24: ORAL_TABLET | ORAL | 2 refills | Status: DC
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
## 2019-07-10 ENCOUNTER — Encounter (HOSPITAL_COMMUNITY): Payer: Self-pay | Admitting: Psychiatry

## 2019-07-10 ENCOUNTER — Other Ambulatory Visit: Payer: Self-pay

## 2019-07-10 ENCOUNTER — Telehealth (INDEPENDENT_AMBULATORY_CARE_PROVIDER_SITE_OTHER): Payer: Medicare Other | Admitting: Psychiatry

## 2019-07-10 DIAGNOSIS — F322 Major depressive disorder, single episode, severe without psychotic features: Secondary | ICD-10-CM

## 2019-07-10 MED ORDER — BUPROPION HCL ER (XL) 300 MG PO TB24: ORAL_TABLET | ORAL | 2 refills | Status: DC
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
## 2019-09-07 ENCOUNTER — Encounter (HOSPITAL_COMMUNITY): Payer: Self-pay | Admitting: Psychiatry

## 2019-09-07 ENCOUNTER — Other Ambulatory Visit: Payer: Self-pay

## 2019-09-07 ENCOUNTER — Telehealth (INDEPENDENT_AMBULATORY_CARE_PROVIDER_SITE_OTHER): Payer: Medicare Other | Admitting: Psychiatry

## 2019-09-07 DIAGNOSIS — F322 Major depressive disorder, single episode, severe without psychotic features: Secondary | ICD-10-CM

## 2019-09-07 MED ORDER — BUPROPION HCL ER (XL) 300 MG PO TB24: ORAL_TABLET | ORAL | 2 refills | Status: DC
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
## 2019-12-12 ENCOUNTER — Telehealth (HOSPITAL_COMMUNITY): Payer: Self-pay | Admitting: Psychiatry

## 2019-12-12 NOTE — Telephone Encounter (Signed)
Called to schedule f/u, left vm 

## 2019-12-21 ENCOUNTER — Other Ambulatory Visit: Payer: Self-pay

## 2019-12-21 ENCOUNTER — Telehealth (INDEPENDENT_AMBULATORY_CARE_PROVIDER_SITE_OTHER): Payer: Medicare Other | Admitting: Psychiatry

## 2019-12-21 ENCOUNTER — Encounter (HOSPITAL_COMMUNITY): Payer: Self-pay | Admitting: Psychiatry

## 2019-12-21 DIAGNOSIS — F322 Major depressive disorder, single episode, severe without psychotic features: Secondary | ICD-10-CM | POA: Diagnosis not present

## 2019-12-21 MED ORDER — BUPROPION HCL ER (XL) 300 MG PO TB24: ORAL_TABLET | ORAL | 2 refills | Status: DC

## 2019-12-21 NOTE — Progress Notes (Signed)
Virtual Visit via Telephone Note  I connected with Joe Jennings on 12/21/19 at 11:20 AM EDT by telephone and verified that I am speaking with the correct person using two identifiers.  Location: Patient: home Provider: office   I discussed the limitations, risks, security and privacy concerns of performing an evaluation and management service by telephone and the availability of in person appointments. I also discussed with the patient that there may be a patient responsible charge related to this service. The patient expressed understanding and agreed to proceed.     I discussed the assessment and treatment plan with the patient. The patient was provided an opportunity to ask questions and all were answered. The patient agreed with the plan and demonstrated an understanding of the instructions.   The patient was advised to call back or seek an in-person evaluation if the symptoms worsen or if the condition fails to improve as anticipated.  I provided 15 minutes of non-face-to-face time during this encounter.   Diannia Ruder, MD  [facility] MD/PA/NP OP Progress Note  12/21/2019 11:31 AM Joe Jennings  MRN:  [id number]  Chief Complaint:  Chief Complaint    Depression; Follow-up    this patient is a 64 year old married white male who lives with his wife in [place]. They have moved from [place] about 2 years ago. He has a daughter and son in [place] and one stepson who died at age 38 from side effects from psychiatric medications. 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  The patient was referred by the [facility] where he was hospitalized from April 20 to the 26th 2016 for symptoms of depression and chronic pain.  The patient returns for follow-up after 3 months.  He still not happy with his pain management services at [facility].  He did convince the physician there to give him 1 more  dose of the day of the oxycodone 10 mg.  He states however that it still is not helping his pain in numerous joints.  He also may be looking at a shoulder replacement surgery.  For the most part his mood has been stable but he would really like to have improvement in his pain.  I again explained that if he is not happy with the current situation or other pain management centers.  He denies serious depression thoughts of self-harm.  He is sleeping fairly well HPI:  Visit Diagnosis:    ICD-10-CM   1. Major depressive disorder, single episode, severe without psychotic features (HCC)  F32.2     Past Psychiatric History: 1 prior psychiatric hospitalization  Past Medical History:  Past Medical History:  Diagnosis Date  . Arthritis   . Chronic pain   . Deep vein thrombosis (HCC) 2003   after left knee surgery  . Depression   . GERD (gastroesophageal reflux disease)   . Headache   . Neuromuscular disorder [facility])     Past Surgical History:  Procedure Laterality Date  . ABDOMINAL SURGERY     vericaseal per patient history   . acromial plasty Left 1993  . RESECTION DISTAL CLAVICAL  1993  . ROTATOR CUFF REPAIR Right 1996  . SHOULDER SURGERY Bilateral     Family Psychiatric History: see below  Family History:  Family History  Problem Relation Age of Onset  . Bipolar disorder Mother   . Kidney disease Mother   . Bipolar disorder Sister   . Bipolar disorder Sister   .  High blood pressure Father   . Diabetes Father     Social History:  Social History   Socioeconomic History  . Marital status: Married    Spouse name: Not on file  . Number of children: Not on file  . Years of education: Not on file  . Highest education level: Not on file  Occupational History  . Not on file  Tobacco Use  . Smoking status: Never Smoker  . Smokeless tobacco: Never Used  Substance and Sexual Activity  . Alcohol use: No  . Drug use: No  . Sexual activity: Yes  Other Topics Concern  . Not on  file  Social History Narrative  . Not on file   Social Determinants of Health   Financial Resource Strain:   . Difficulty of Paying Living Expenses: Not on file  Food Insecurity:   . Worried About Programme researcher, broadcasting/film/video in the Last Year: Not on file  . Ran Out of Food in the Last Year: Not on file  Transportation Needs:   . Lack of Transportation (Medical): Not on file  . Lack of Transportation (Non-Medical): Not on file  Physical Activity:   . Days of Exercise per Week: Not on file  . Minutes of Exercise per Session: Not on file  Stress:   . Feeling of Stress : Not on file  Social Connections:   . Frequency of Communication with Friends and Family: Not on file  . Frequency of Social Gatherings with Friends and Family: Not on file  . Attends Religious Services: Not on file  . Active Member of Clubs or Organizations: Not on file  . Attends Banker Meetings: Not on file  . Marital Status: Not on file    Allergies: No Known Allergies  Metabolic Disorder Labs: No results found for: HGBA1C, MPG No results found for: PROLACTIN No results found for: CHOL, TRIG, HDL, CHOLHDL, VLDL, LDLCALC No results found for: TSH  Therapeutic Level Labs: No results found for: LITHIUM Lab Results  Component Value Date   VALPROATE 144.5 (H) 08/07/2014   VALPROATE 51.8 06/08/2014   No components found for:  CBMZ  Current Medications: Current Outpatient Medications  Medication Sig Dispense Refill  . buPROPion (WELLBUTRIN XL) 300 MG 24 hr tablet TAKE 1 TABLET(300 MG) BY MOUTH DAILY 90 tablet 2  . Celecoxib (CELEBREX PO) Take by mouth 2 (two) times daily.    . diphenhydrAMINE (SOMINEX) 25 MG tablet Take by mouth.    . fenofibrate (TRICOR) 145 MG tablet Take 145 mg by mouth daily.    . irbesartan (AVAPRO) 150 MG tablet Take 150 mg by mouth 2 (two) times daily.    . Multiple Vitamins-Minerals (MULTIVITAMIN WITH MINERALS) tablet Take 1 tablet by mouth daily. For nutritional  supplementation 30 tablet   . Omega-3 Fatty Acids (FISH OIL CONCENTRATE) 300 MG CAPS Take 300 mg by mouth daily.    . Oxycodone HCl 10 MG TABS Take 10 mg by mouth 4 (four) times daily as needed.    . Pantoprazole Sodium (PROTONIX PO) Take by mouth daily.    . polyethylene glycol (MIRALAX / GLYCOLAX) packet Take 17 g by mouth daily.    Marland Kitchen testosterone cypionate (DEPOTESTOSTERONE CYPIONATE) 200 MG/ML injection every 7 (seven) days.     Marland Kitchen tiZANidine (ZANAFLEX) 4 MG tablet TAKE 1 TO 2 TABLETS BY MOUTH 3 TIMES DAILY    . topiramate (TOPAMAX) 50 MG tablet 1 tab nightly x 7 days then 2 tabs nightly x  7 days then 3 tabs HS    . Vitamin D, Ergocalciferol, (DRISDOL) 50000 units CAPS capsule Take 50,000 Units by mouth every 7 (seven) days.     No current facility-administered medications for this visit.     Musculoskeletal: Strength & Muscle Tone: within normal limits Gait & Station: normal Patient leans: N/A  Psychiatric Specialty Exam: Review of Systems  Musculoskeletal: Positive for arthralgias, joint swelling and neck pain.  All other systems reviewed and are negative.   There were no vitals taken for this visit.There is no height or weight on file to calculate BMI.  General Appearance: NA  Eye Contact:  NA  Speech:  Clear and Coherent  Volume:  Normal  Mood:  Euthymic  Affect:  NA  Thought Process:  Goal Directed  Orientation:  Full (Time, Place, and Person)  Thought Content: Rumination   Suicidal Thoughts:  No  Homicidal Thoughts:  No  Memory:  Immediate;   Good Recent;   Good Remote;   Fair  Judgement:  Good  Insight:  Fair  Psychomotor Activity:  Normal  Concentration:  Concentration: Good and Attention Span: Good  Recall:  Good  Fund of Knowledge: Good  Language: Good  Akathisia:  No  Handed:  Right  AIMS (if indicated): not done  Assets:  Communication Skills Desire for Improvement Resilience Social Support Talents/Skills  ADL's:  Intact  Cognition: WNL  Sleep:   Good   Screenings: AIMS     [facility]  AIMS Total Score 0    AUDIT     Ad[facility] ([facility]  Alcohol Use Disorder Identification Test Final Score (AUDIT) 0       Assessment and Plan: This patient is a 64 year old male with a history of depression and chronic pain.  He is still not happy with his pain management program and I have urged him to try something else but he seems to want to stick with it and try various doctors within the system.  For now he will stay with Wellbutrin XL 300 mg daily as he feels it is helped his depression.  He will return to see me in 3 months   Diannia Ruder, MD 12/21/2019, 11:31 AM

## 2020-03-05 ENCOUNTER — Telehealth (HOSPITAL_COMMUNITY): Payer: Self-pay | Admitting: Psychiatry

## 2020-03-05 NOTE — Telephone Encounter (Signed)
Called to schedule f/u, left vm 

## 2020-03-20 ENCOUNTER — Encounter (HOSPITAL_COMMUNITY): Payer: Self-pay | Admitting: Psychiatry

## 2020-03-20 ENCOUNTER — Telehealth (INDEPENDENT_AMBULATORY_CARE_PROVIDER_SITE_OTHER): Payer: Medicare Other | Admitting: Psychiatry

## 2020-03-20 ENCOUNTER — Other Ambulatory Visit: Payer: Self-pay

## 2020-03-20 DIAGNOSIS — F322 Major depressive disorder, single episode, severe without psychotic features: Secondary | ICD-10-CM | POA: Diagnosis not present

## 2020-03-20 MED ORDER — HYDROXYZINE PAMOATE 100 MG PO CAPS: 100.0000 mg | ORAL_CAPSULE | Freq: Every day | ORAL | 2 refills | Status: DC

## 2020-03-20 MED ORDER — BUPROPION HCL ER (XL) 300 MG PO TB24: ORAL_TABLET | ORAL | 2 refills | Status: DC

## 2020-03-20 NOTE — Progress Notes (Signed)
Virtual Visit via Telephone Note  I connected with Joe Jennings on 03/20/20 at  1:00 PM EST by telephone and verified that I am speaking with the correct person using two identifiers.  Location: Patient: home Provider: home   I discussed the limitations, risks, security and privacy concerns of performing an evaluation and management service by telephone and the availability of in person appointments. I also discussed with the patient that there may be a patient responsible charge related to this service. The patient expressed understanding and agreed to proceed.     I discussed the assessment and treatment plan with the patient. The patient was provided an opportunity to ask questions and all were answered. The patient agreed with the plan and demonstrated an understanding of the instructions.   The patient was advised to call back or seek an in-person evaluation if the symptoms worsen or if the condition fails to improve as anticipated.  I provided 15 minutes of non-face-to-face time during this encounter.   Joe Ruder, MD  [facility] MD/PA/NP OP Progress Note  03/20/2020 1:26 PM [name]  MRN:  [id number]  Chief Complaint:  Chief Complaint    Depression; Anxiety; Follow-up     HPI: this patient is a 65 year old married white male who lives with his wife in [place]. They have moved from [place] about 2 years ago. He has a daughter and son in [place] and one stepson who died at age 49 from side effects from psychiatric medications. 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  The patient was referred by the [facility] where he was hospitalized from April 20 to the 26th 2016 for symptoms of depression and chronic pain  The patient returns after 3 months regarding follow-up for depression.  He states he is still struggling with pain management at [facility].  He does not think he  is getting enough relief from the current dosage of oxycodone 15 mg 4 times daily.  He feels like he is not being listened to there and that no one understands extent of his pain.  I suggested that if he is not happy there that he get a new referral from his primary care physician and he agrees.  His mood has been fairly stable but he is having a lot of trouble sleeping.  Trazodone made him too groggy and doxepin caused nightmares so I suggested we try hydroxyzine.  The Wellbutrin still seems to be helping his depression and he denies any thoughts of self-harm. Visit Diagnosis:    ICD-10-CM   1. Major depressive disorder, single episode, severe without psychotic features (HCC)  F32.2     Past Psychiatric History: 1 prior psychiatric hospitalization  Past Medical History:  Past Medical History:  Diagnosis Date  . Arthritis   . Chronic pain   . Deep vein thrombosis (HCC) 2003   after left knee surgery  . Depression   . GERD (gastroesophageal reflux disease)   . Headache   . Neuromuscular disorder [facility])     Past Surgical History:  Procedure Laterality Date  . ABDOMINAL SURGERY     vericaseal per patient history   . acromial plasty Left 1993  . RESECTION DISTAL CLAVICAL  1993  . ROTATOR CUFF REPAIR Right 1996  . SHOULDER SURGERY Bilateral     Family Psychiatric History: see below  Family History:  Family History  Problem Relation Age of Onset  . Bipolar disorder Mother   .  Kidney disease Mother   . Bipolar disorder Sister   . Bipolar disorder Sister   . High blood pressure Father   . Diabetes Father     Social History:  Social History   Socioeconomic History  . Marital status: Married    Spouse name: Not on file  . Number of children: Not on file  . Years of education: Not on file  . Highest education level: Not on file  Occupational History  . Not on file  Tobacco Use  . Smoking status: Never Smoker  . Smokeless tobacco: Never Used  Substance and Sexual Activity   . Alcohol use: No  . Drug use: No  . Sexual activity: Yes  Other Topics Concern  . Not on file  Social History Narrative  . Not on file   Social Determinants of Health   Financial Resource Strain: Not on file  Food Insecurity: Not on file  Transportation Needs: Not on file  Physical Activity: Not on file  Stress: Not on file  Social Connections: Not on file    Allergies: No Known Allergies  Metabolic Disorder Labs: No results found for: HGBA1C, MPG No results found for: PROLACTIN No results found for: CHOL, TRIG, HDL, CHOLHDL, VLDL, LDLCALC No results found for: TSH  Therapeutic Level Labs: No results found for: LITHIUM Lab Results  Component Value Date   VALPROATE 144.5 (H) 08/07/2014   VALPROATE 51.8 06/08/2014   No components found for:  CBMZ  Current Medications: Current Outpatient Medications  Medication Sig Dispense Refill  . hydrOXYzine (VISTARIL) 100 MG capsule Take 1 capsule (100 mg total) by mouth at bedtime. 30 capsule 2  . buPROPion (WELLBUTRIN XL) 300 MG 24 hr tablet TAKE 1 TABLET(300 MG) BY MOUTH DAILY 90 tablet 2  . Celecoxib (CELEBREX PO) Take by mouth 2 (two) times daily.    . diphenhydrAMINE (SOMINEX) 25 MG tablet Take by mouth.    . fenofibrate (TRICOR) 145 MG tablet Take 145 mg by mouth daily.    . irbesartan (AVAPRO) 150 MG tablet Take 150 mg by mouth 2 (two) times daily.    . Multiple Vitamins-Minerals (MULTIVITAMIN WITH MINERALS) tablet Take 1 tablet by mouth daily. For nutritional supplementation 30 tablet   . Omega-3 Fatty Acids (FISH OIL CONCENTRATE) 300 MG CAPS Take 300 mg by mouth daily.    . Oxycodone HCl 10 MG TABS Take 10 mg by mouth 4 (four) times daily as needed.    . Pantoprazole Sodium (PROTONIX PO) Take by mouth daily.    . polyethylene glycol (MIRALAX / GLYCOLAX) packet Take 17 g by mouth daily.    Marland Kitchen testosterone cypionate (DEPOTESTOSTERONE CYPIONATE) 200 MG/ML injection every 7 (seven) days.     Marland Kitchen tiZANidine (ZANAFLEX) 4 MG  tablet TAKE 1 TO 2 TABLETS BY MOUTH 3 TIMES DAILY    . topiramate (TOPAMAX) 50 MG tablet 1 tab nightly x 7 days then 2 tabs nightly x 7 days then 3 tabs HS    . Vitamin D, Ergocalciferol, (DRISDOL) 50000 units CAPS capsule Take 50,000 Units by mouth every 7 (seven) days.     No current facility-administered medications for this visit.     Musculoskeletal: Strength & Muscle Tone: within normal limits Gait & Station: normal Patient leans: N/A  Psychiatric Specialty Exam: Review of Systems  Musculoskeletal: Positive for arthralgias, back pain, joint swelling and neck pain.  Psychiatric/Behavioral: Positive for sleep disturbance.  All other systems reviewed and are negative.   There were no vitals  taken for this visit.There is no height or weight on file to calculate BMI.  General Appearance: NA  Eye Contact:  NA  Speech:  Clear and Coherent  Volume:  Normal  Mood:  Anxious  Affect:  NA  Thought Process:  Goal Directed  Orientation:  Full (Time, Place, and Person)  Thought Content: Rumination   Suicidal Thoughts:  No  Homicidal Thoughts:  No  Memory:  Immediate;   Good Recent;   Good Remote;   Good  Judgement:  Fair  Insight:  Fair  Psychomotor Activity:  Decreased  Concentration:  Concentration: Good and Attention Span: Good  Recall:  Good  Fund of Knowledge: Good  Language: Good  Akathisia:  No  Handed:  Right  AIMS (if indicated): not done  Assets:  Communication Skills Desire for Improvement Resilience Social Support Talents/Skills  ADL's:  Intact  Cognition: WNL  Sleep:  Poor   Screenings: AIMS   Flowsheet Row [facility]  AIMS Total Score 0    AUDIT   Flowsheet Row Ad[facility] ([facility]  Alcohol Use Disorder Identification Test Final Score (AUDIT) 0       Assessment and Plan: This patient is a 65 year old male  with a history of depression and chronic pain.  Again I have told him to switch to a new pain management specialist if he is not happy and he seems to always go back to the same 1 regardless.  For now we will continue Wellbutrin XL 300 mg daily as he feels it has helped his depression.  We will add hydroxyzine 100 mg at bedtime for sleep.  He will return to see me in 65-month   Joe Ruder, MD 03/20/2020, 1:26 PM

## 2020-06-18 ENCOUNTER — Other Ambulatory Visit: Payer: Self-pay

## 2020-06-18 ENCOUNTER — Telehealth (INDEPENDENT_AMBULATORY_CARE_PROVIDER_SITE_OTHER): Payer: Medicare Other | Admitting: Psychiatry

## 2020-06-18 ENCOUNTER — Encounter (HOSPITAL_COMMUNITY): Payer: Self-pay | Admitting: Psychiatry

## 2020-06-18 ENCOUNTER — Encounter (HOSPITAL_COMMUNITY): Payer: Self-pay

## 2020-06-18 DIAGNOSIS — F322 Major depressive disorder, single episode, severe without psychotic features: Secondary | ICD-10-CM

## 2020-06-18 MED ORDER — BUPROPION HCL ER (XL) 300 MG PO TB24: ORAL_TABLET | ORAL | 2 refills | Status: DC

## 2020-06-18 MED ORDER — HYDROXYZINE PAMOATE 100 MG PO CAPS: 100.0000 mg | ORAL_CAPSULE | Freq: Every day | ORAL | 2 refills | Status: DC

## 2020-06-18 NOTE — Progress Notes (Signed)
Virtual Visit via Telephone Note  I connected with Joe Jennings on 06/18/20 at  1:00 PM EDT by telephone and verified that I am speaking with the correct person using two identifiers.  Location: Patient: home Provider: office   I discussed the limitations, risks, security and privacy concerns of performing an evaluation and management service by telephone and the availability of in person appointments. I also discussed with the patient that there may be a patient responsible charge related to this service. The patient expressed understanding and agreed to proceed.       I discussed the assessment and treatment plan with the patient. The patient was provided an opportunity to ask questions and all were answered. The patient agreed with the plan and demonstrated an understanding of the instructions.   The patient was advised to call back or seek an in-person evaluation if the symptoms worsen or if the condition fails to improve as anticipated.  I provided 15 minutes of non-face-to-face time during this encounter.   Diannia Ruder, MD  [facility] MD/PA/NP OP Progress Note  06/18/2020 1:22 PM Joe Jennings  MRN:  [id number]  Chief Complaint:  Chief Complaint    Depression; Follow-up     HPI: this patient is a 65 year old married white male who lives with his wife in [place]. They have moved from [place] about 2 years ago. He has a daughter and son in [place] and one stepson who died at age 80 from side effects from psychiatric medications. 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  The patient was referred by the [facility] where he was hospitalized from April 20 to the 26th 2016 for symptoms of depression and chronic pain  The patient returns for follow-up after 3 months.  He states that he is still struggling with pain management.  He is going to be going to an nurse practitioner  who used to work at the [facility] where he goes now.  She is moving into a another practice in [place].  He feels like she actually listen to him.  The practitioner at [facility] recently added a small dose of Oxley Cotton-10 mg to his regimen of oxycodone but he does not think it is helped very much.  Despite this he states his mood is pretty good and the Wellbutrin has helped.  The hydroxyzine helps somewhat with his sleep but he still having a lot of pain. Visit Diagnosis:    ICD-10-CM   1. Major depressive disorder, single episode, severe without psychotic features (HCC)  F32.2     Past Psychiatric History: 1 prior psychiatric hospitalization  Past Medical History:  Past Medical History:  Diagnosis Date  . Arthritis   . Chronic pain   . Deep vein thrombosis (HCC) 2003   after left knee surgery  . Depression   . GERD (gastroesophageal reflux disease)   . Headache   . Neuromuscular disorder [facility])     Past Surgical History:  Procedure Laterality Date  . ABDOMINAL SURGERY     vericaseal per patient history   . acromial plasty Left 1993  . RESECTION DISTAL CLAVICAL  1993  . ROTATOR CUFF REPAIR Right 1996  . SHOULDER SURGERY Bilateral     Family Psychiatric History: see below  Family History:  Family History  Problem Relation Age of Onset  . Bipolar disorder Mother   . Kidney disease Mother   . Bipolar disorder Sister   .  Bipolar disorder Sister   . High blood pressure Father   . Diabetes Father     Social History:  Social History   Socioeconomic History  . Marital status: Married    Spouse name: Not on file  . Number of children: Not on file  . Years of education: Not on file  . Highest education level: Not on file  Occupational History  . Not on file  Tobacco Use  . Smoking status: Never Smoker  . Smokeless tobacco: Never Used  Substance and Sexual Activity  . Alcohol use: No  . Drug use: No  . Sexual activity: Yes  Other Topics Concern  . Not on file   Social History Narrative  . Not on file   Social Determinants of Health   Financial Resource Strain: Not on file  Food Insecurity: Not on file  Transportation Needs: Not on file  Physical Activity: Not on file  Stress: Not on file  Social Connections: Not on file    Allergies: No Known Allergies  Metabolic Disorder Labs: No results found for: HGBA1C, MPG No results found for: PROLACTIN No results found for: CHOL, TRIG, HDL, CHOLHDL, VLDL, LDLCALC No results found for: TSH  Therapeutic Level Labs: No results found for: LITHIUM Lab Results  Component Value Date   VALPROATE 144.5 (H) 08/07/2014   VALPROATE 51.8 06/08/2014   No components found for:  CBMZ  Current Medications: Current Outpatient Medications  Medication Sig Dispense Refill  . buPROPion (WELLBUTRIN XL) 300 MG 24 hr tablet TAKE 1 TABLET(300 MG) BY MOUTH DAILY 90 tablet 2  . Celecoxib (CELEBREX PO) Take by mouth 2 (two) times daily.    . diphenhydrAMINE (SOMINEX) 25 MG tablet Take by mouth.    . fenofibrate (TRICOR) 145 MG tablet Take 145 mg by mouth daily.    . hydrOXYzine (VISTARIL) 100 MG capsule Take 1 capsule (100 mg total) by mouth at bedtime. 90 capsule 2  . irbesartan (AVAPRO) 150 MG tablet Take 150 mg by mouth 2 (two) times daily.    . Multiple Vitamins-Minerals (MULTIVITAMIN WITH MINERALS) tablet Take 1 tablet by mouth daily. For nutritional supplementation 30 tablet   . Omega-3 Fatty Acids (FISH OIL CONCENTRATE) 300 MG CAPS Take 300 mg by mouth daily.    . Oxycodone HCl 10 MG TABS Take 10 mg by mouth 4 (four) times daily as needed.    . Pantoprazole Sodium (PROTONIX PO) Take by mouth daily.    . polyethylene glycol (MIRALAX / GLYCOLAX) packet Take 17 g by mouth daily.    Marland Kitchen testosterone cypionate (DEPOTESTOSTERONE CYPIONATE) 200 MG/ML injection every 7 (seven) days.     Marland Kitchen tiZANidine (ZANAFLEX) 4 MG tablet TAKE 1 TO 2 TABLETS BY MOUTH 3 TIMES DAILY    . topiramate (TOPAMAX) 50 MG tablet 1 tab nightly  x 7 days then 2 tabs nightly x 7 days then 3 tabs HS    . Vitamin D, Ergocalciferol, (DRISDOL) 50000 units CAPS capsule Take 50,000 Units by mouth every 7 (seven) days.     No current facility-administered medications for this visit.     Musculoskeletal: Strength & Muscle Tone: within normal limits Gait & Station: normal Patient leans: N/A  Psychiatric Specialty Exam: Review of Systems  Musculoskeletal: Positive for arthralgias, back pain and neck pain.  All other systems reviewed and are negative.   There were no vitals taken for this visit.There is no height or weight on file to calculate BMI.  General Appearance: NA  Eye  Contact:  NA  Speech:  Clear and Coherent  Volume:  Normal  Mood:  Euthymic  Affect:  NA  Thought Process:  Goal Directed  Orientation:  Full (Time, Place, and Person)  Thought Content: WDL   Suicidal Thoughts:  No  Homicidal Thoughts:  No  Memory:  Immediate;   Good Recent;   Good Remote;   Fair  Judgement:  Good  Insight:  Fair  Psychomotor Activity:  Decreased  Concentration:  Concentration: Good and Attention Span: Good  Recall:  Good  Fund of Knowledge: Good  Language: Good  Akathisia:  No  Handed:  Right  AIMS (if indicated): not done  Assets:  Communication Skills Desire for Improvement Resilience Social Support Talents/Skills  ADL's:  Intact  Cognition: WNL  Sleep:  Fair   Screenings: AIMS   Flowsheet Row [facility]  AIMS Total Score 0    AUDIT   Flowsheet Row Ad[facility] ([facility]  Alcohol Use Disorder Identification Test Final Score (AUDIT) 0    PHQ2-9   Flowsheet Row Video Visit from [facility]18[facility]2020 [facility]  PHQ-2 Total Score 0    Flowsheet Row Video Visit from 06/18/2020 in [facility]  C-SSRS RISK  CATEGORY No Risk       Assessment and Plan: This patient is a 65 year old male with a history of depression and chronic pain.  He is finally made a decision to switch to a different pain management clinic and I think this is a good decision for him.  For now he will continue Wellbutrin XL 300 mg daily as it has helped his depression and hydroxyzine 100 mg at bedtime for sleep.  He will return to see me in 39-months   Diannia Ruder, MD 06/18/2020, 1:22 PM

## 2020-10-17 ENCOUNTER — Other Ambulatory Visit: Payer: Self-pay

## 2020-10-17 ENCOUNTER — Encounter (HOSPITAL_COMMUNITY): Payer: Self-pay | Admitting: Psychiatry

## 2020-10-17 ENCOUNTER — Telehealth (INDEPENDENT_AMBULATORY_CARE_PROVIDER_SITE_OTHER): Payer: Medicare Other | Admitting: Psychiatry

## 2020-10-17 DIAGNOSIS — F322 Major depressive disorder, single episode, severe without psychotic features: Secondary | ICD-10-CM

## 2020-10-17 MED ORDER — BUPROPION HCL ER (XL) 300 MG PO TB24: ORAL_TABLET | ORAL | 2 refills | Status: DC

## 2020-10-17 NOTE — Progress Notes (Signed)
Virtual Visit via Telephone Note  I connected with Joe Jennings on 10/17/20 at  1:20 PM EDT by telephone and verified that I am speaking with the correct person using two identifiers.  Location: Patient: home  Provider: home office   I discussed the limitations, risks, security and privacy concerns of performing an evaluation and management service by telephone and the availability of in person appointments. I also discussed with the patient that there may be a patient responsible charge related to this service. The patient expressed understanding and agreed to proceed.     I discussed the assessment and treatment plan with the patient. The patient was provided an opportunity to ask questions and all were answered. The patient agreed with the plan and demonstrated an understanding of the instructions.   The patient was advised to call back or seek an in-person evaluation if the symptoms worsen or if the condition fails to improve as anticipated.  I provided 15 minutes of non-face-to-face time during this encounter.   Joe Ruder, MD  [facility] MD/PA/NP OP Progress Note  10/17/2020 1:37 PM Joe Jennings  MRN:  [id number]  Chief Complaint:  Chief Complaint   Depression; Follow-up    HPI: this patient is a 65 year old married white male who lives with his wife  in [place]. They have moved from [place] about 2 years ago. He has a daughter and son in [place] and one stepson who died at age 87 from side effects from psychiatric medications. 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  The patient returns for follow-up after 4 months regarding his depression.  He states he is doing somewhat better.  He has switched his pain management to [facility] in [facility].  He had been at [facility] for pain management but did not feel like he was getting the proper treatment there.  He states that his doctor at the  new clinic is more willing to work with him regarding dosages of his medication.  He is now on a higher dose of Oxley codon as well as OxyContin for breakthrough pain.  He still has pain particularly at night but is better than it had been in the past.  The patient denies significant depression right now.  He still does not sleep that well due to pain but everything we have tried for sleep is caused significant nightmares.  He is able to tolerate melatonin and Benadryl.  He states that his mood has been good and he has been trying to stay very active.  He denies significant anxiety.  He denies suicidal ideation Visit Diagnosis:    ICD-10-CM   1. Major depressive disorder, single episode, severe without psychotic features (HCC)  F32.2       Past Psychiatric History: 1 prior psychiatric hospitalization  Past Medical History:  Past Medical History:  Diagnosis Date   Arthritis    Chronic pain    Deep vein thrombosis (HCC) 2003   after left knee surgery   Depression    GERD (gastroesophageal reflux disease)    Headache    Neuromuscular disorder (HCC)     Past Surgical History:  Procedure Laterality Date   ABDOMINAL SURGERY     vericaseal per patient history    acromial plasty Left 1993   RESECTION DISTAL CLAVICAL  1993   ROTATOR CUFF REPAIR Right 1996   SHOULDER SURGERY Bilateral     Family Psychiatric History: see below  Family History:  Family History  Problem Relation Age of Onset   Bipolar disorder Mother    Kidney disease Mother    Bipolar disorder Sister    Bipolar disorder Sister    High blood pressure Father    Diabetes Father     Social History:  Social History   Socioeconomic History   Marital status: Married    Spouse name: Not on file   Number of children: Not on file   Years of education: Not on file   Highest education level: Not on file  Occupational History   Not on file  Tobacco Use   Smoking status: Never   Smokeless tobacco: Never  Substance  and Sexual Activity   Alcohol use: No   Drug use: No   Sexual activity: Yes  Other Topics Concern   Not on file  Social History Narrative   Not on file   Social Determinants of Health   Financial Resource Strain: Not on file  Food Insecurity: Not on file  Transportation Needs: Not on file  Physical Activity: Not on file  Stress: Not on file  Social Connections: Not on file    Allergies: No Known Allergies  Metabolic Disorder Labs: No results found for: HGBA1C, MPG No results found for: PROLACTIN No results found for: CHOL, TRIG, HDL, CHOLHDL, VLDL, LDLCALC No results found for: TSH  Therapeutic Level Labs: No results found for: LITHIUM Lab Results  Component Value Date   VALPROATE 144.5 (H) 08/07/2014   VALPROATE 51.8 06/08/2014   No components found for:  CBMZ  Current Medications: Current Outpatient Medications  Medication Sig Dispense Refill   buPROPion (WELLBUTRIN XL) 300 MG 24 hr tablet TAKE 1 TABLET(300 MG) BY MOUTH DAILY 90 tablet 2   Celecoxib (CELEBREX PO) Take by mouth 2 (two) times daily.     diphenhydrAMINE (SOMINEX) 25 MG tablet Take by mouth.     fenofibrate (TRICOR) 145 MG tablet Take 145 mg by mouth daily.     irbesartan (AVAPRO) 150 MG tablet Take 150 mg by mouth 2 (two) times daily.     Multiple Vitamins-Minerals (MULTIVITAMIN WITH MINERALS) tablet Take 1 tablet by mouth daily. For nutritional supplementation 30 tablet    Omega-3 Fatty Acids (FISH OIL CONCENTRATE) 300 MG CAPS Take 300 mg by mouth daily.     oxycodone (ROXICODONE) 30 MG immediate release tablet Take 30 mg by mouth every 4 (four) hours.     Oxycodone HCl 10 MG TABS Take 10 mg by mouth 4 (four) times daily as needed.     OXYCONTIN 20 MG 12 hr tablet SMARTSIG:1 Tablet(s) By Mouth Every 12 Hours     Pantoprazole Sodium (PROTONIX PO) Take by mouth daily.     polyethylene glycol (MIRALAX / GLYCOLAX) packet Take 17 g by mouth daily.     testosterone cypionate (DEPOTESTOSTERONE CYPIONATE)  200 MG/ML injection every 7 (seven) days.      tiZANidine (ZANAFLEX) 4 MG tablet TAKE 1 TO 2 TABLETS BY MOUTH 3 TIMES DAILY     topiramate (TOPAMAX) 50 MG tablet 1 tab nightly x 7 days then 2 tabs nightly x 7 days then 3 tabs HS     Vitamin D, Ergocalciferol, (DRISDOL) 50000 units CAPS capsule Take 50,000 Units by mouth every 7 (seven) days.     No current facility-administered medications for this visit.     Musculoskeletal: Strength & Muscle Tone:  na Gait & Station:  na Patient leans: N/A  Psychiatric Specialty Exam: Review of Systems  Musculoskeletal:  Positive for arthralgias, back pain and neck pain.  All other systems reviewed and are negative.  There were no vitals taken for this visit.There is no height or weight on file to calculate BMI.  General Appearance: NA  Eye Contact:  NA  Speech:  Clear and Coherent  Volume:  Normal  Mood:  Euthymic  Affect:  NA  Thought Process:  Goal Directed  Orientation:  Full (Time, Place, and Person)  Thought Content: Rumination   Suicidal Thoughts:  No  Homicidal Thoughts:  No  Memory:  Immediate;   Good Recent;   Good Remote;   Good  Judgement:  Good  Insight:  Fair  Psychomotor Activity:  Normal  Concentration:  Concentration: Good and Attention Span: Good  Recall:  Good  Fund of Knowledge: Good  Language: Good  Akathisia:  No  Handed:  Right  AIMS (if indicated): not done  Assets:  Communication Skills Desire for Improvement Resilience Social Support Talents/Skills  ADL's:  Intact  Cognition: WNL  Sleep:  Fair   Screenings: AIMS    Flowsheet Row [facility]  AIMS Total Score 0      AUDIT    Flowsheet Row Ad[facility] ([facility]  Alcohol Use Disorder Identification Test Final Score (AUDIT) 0      PHQ2-9    Flowsheet Row Video Visit from [facility]17[facility]2020 [facility] Video Visit from 06/18/2020 [facility]  PHQ-2 Total Score 1 0      Flowsheet Row Video Visit from 10/17/2020 in [facility] Video Visit from 06/18/2020 in [facility]  C-SSRS RISK CATEGORY No Risk No Risk        Assessment and Plan: This patient is a 65 year old male with a history of depression and chronic pain.  He is doing well since switching to a different pain management clinic.  He will continue Wellbutrin XL 300 mg daily for depression.  He could not tolerate the hydroxyzine and chooses to use either melatonin or Benadryl for sleep.  He will return to see me in 3 months   Joe Ruder, MD 10/17/2020, 1:37 PM

## 2020-11-11 ENCOUNTER — Telehealth (HOSPITAL_COMMUNITY): Payer: Self-pay | Admitting: Psychiatry

## 2020-11-11 NOTE — Telephone Encounter (Signed)
Called to schedule f/u appt left vm 

## 2021-01-15 ENCOUNTER — Telehealth (INDEPENDENT_AMBULATORY_CARE_PROVIDER_SITE_OTHER): Payer: Medicare Other | Admitting: Psychiatry

## 2021-01-15 ENCOUNTER — Other Ambulatory Visit: Payer: Self-pay

## 2021-01-15 ENCOUNTER — Encounter (HOSPITAL_COMMUNITY): Payer: Self-pay | Admitting: Psychiatry

## 2021-01-15 DIAGNOSIS — F322 Major depressive disorder, single episode, severe without psychotic features: Secondary | ICD-10-CM | POA: Diagnosis not present

## 2021-01-15 MED ORDER — BUPROPION HCL ER (XL) 300 MG PO TB24: ORAL_TABLET | ORAL | 2 refills | Status: DC

## 2021-01-15 NOTE — Progress Notes (Signed)
Virtual Visit via Telephone Note  I connected with Joe Jennings on 01/15/21 at  1:00 PM EST by telephone and verified that I am speaking with the correct person using two identifiers.  Location: Patient: home Provider: office   I discussed the limitations, risks, security and privacy concerns of performing an evaluation and management service by telephone and the availability of in person appointments. I also discussed with the patient that there may be a patient responsible charge related to this service. The patient expressed understanding and agreed to proceed.      I discussed the assessment and treatment plan with the patient. The patient was provided an opportunity to ask questions and all were answered. The patient agreed with the plan and demonstrated an understanding of the instructions.   The patient was advised to call back or seek an in-person evaluation if the symptoms worsen or if the condition fails to improve as anticipated.  I provided 15 minutes of non-face-to-face time during this encounter.   Diannia Ruder, MD  [facility] MD/PA/NP OP Progress Note  01/15/2021 1:21 PM Joe Jennings  MRN:  [id number]  Chief Complaint:  Chief Complaint   Depression; Follow-up    HPI: this patient is a 65 year old married white male who lives with his wife  in [place]. They have moved from [place] about 2 years ago. He has a daughter and son in [place] and one stepson who died at age 30 from side effects from psychiatric medications. 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  Patient returns for follow-up after 3 months.  He is still dealing with his chronic pain in his shoulders and back.  He is working with his pain management specialist and they are trying to get brand-name Roxicodone ordered for him.  In the meantime he has joined a gym and is doing some light weightlifting and this is helping him feel  better.  In general his mood has been fairly good and he denies significant depression or anxiety.  His sleep is still somewhat disrupted by pain.  He is hoping once he gets the new medication that this will improve.  He denies any thoughts of self-harm or suicidal ideation and still feels like the Wellbutrin helps his mood Visit Diagnosis:    ICD-10-CM   1. Major depressive disorder, single episode, severe without psychotic features (HCC)  F32.2       Past Psychiatric History: 1 prior psychiatric hospitalization  Past Medical History:  Past Medical History:  Diagnosis Date   Arthritis    Chronic pain    Deep vein thrombosis (HCC) 2003   after left knee surgery   Depression    GERD (gastroesophageal reflux disease)    Headache    Neuromuscular disorder (HCC)     Past Surgical History:  Procedure Laterality Date   ABDOMINAL SURGERY     vericaseal per patient history    acromial plasty Left 1993   RESECTION DISTAL CLAVICAL  1993   ROTATOR CUFF REPAIR Right 1996   SHOULDER SURGERY Bilateral     Family Psychiatric History: see below  Family History:  Family History  Problem Relation Age of Onset   Bipolar disorder Mother    Kidney disease Mother    Bipolar disorder Sister    Bipolar disorder Sister    High blood pressure Father    Diabetes Father     Social History:  Social History   Socioeconomic History  Marital status: Married    Spouse name: Not on file   Number of children: Not on file   Years of education: Not on file   Highest education level: Not on file  Occupational History   Not on file  Tobacco Use   Smoking status: Never   Smokeless tobacco: Never  Substance and Sexual Activity   Alcohol use: No   Drug use: No   Sexual activity: Yes  Other Topics Concern   Not on file  Social History Narrative   Not on file   Social Determinants of Health   Financial Resource Strain: Not on file  Food Insecurity: Not on file  Transportation Needs: Not  on file  Physical Activity: Not on file  Stress: Not on file  Social Connections: Not on file    Allergies: No Known Allergies  Metabolic Disorder Labs: No results found for: HGBA1C, MPG No results found for: PROLACTIN No results found for: CHOL, TRIG, HDL, CHOLHDL, VLDL, LDLCALC No results found for: TSH  Therapeutic Level Labs: No results found for: LITHIUM Lab Results  Component Value Date   VALPROATE 144.5 (H) 08/07/2014   VALPROATE 51.8 06/08/2014   No components found for:  CBMZ  Current Medications: Current Outpatient Medications  Medication Sig Dispense Refill   buPROPion (WELLBUTRIN XL) 300 MG 24 hr tablet TAKE 1 TABLET(300 MG) BY MOUTH DAILY 90 tablet 2   Celecoxib (CELEBREX PO) Take by mouth 2 (two) times daily.     diphenhydrAMINE (SOMINEX) 25 MG tablet Take by mouth.     fenofibrate (TRICOR) 145 MG tablet Take 145 mg by mouth daily.     irbesartan (AVAPRO) 150 MG tablet Take 150 mg by mouth 2 (two) times daily.     Multiple Vitamins-Minerals (MULTIVITAMIN WITH MINERALS) tablet Take 1 tablet by mouth daily. For nutritional supplementation 30 tablet    Omega-3 Fatty Acids (FISH OIL CONCENTRATE) 300 MG CAPS Take 300 mg by mouth daily.     oxycodone (ROXICODONE) 30 MG immediate release tablet Take 30 mg by mouth every 4 (four) hours.     Pantoprazole Sodium (PROTONIX PO) Take by mouth daily.     polyethylene glycol (MIRALAX / GLYCOLAX) packet Take 17 g by mouth daily.     testosterone cypionate (DEPOTESTOSTERONE CYPIONATE) 200 MG/ML injection every 7 (seven) days.      tiZANidine (ZANAFLEX) 4 MG tablet TAKE 1 TO 2 TABLETS BY MOUTH 3 TIMES DAILY     topiramate (TOPAMAX) 50 MG tablet 1 tab nightly x 7 days then 2 tabs nightly x 7 days then 3 tabs HS     Vitamin D, Ergocalciferol, (DRISDOL) 50000 units CAPS capsule Take 50,000 Units by mouth every 7 (seven) days.     No current facility-administered medications for this visit.     Musculoskeletal: Strength & Muscle  Tone: na Gait & Station: na Patient leans: N/A  Psychiatric Specialty Exam: Review of Systems  Musculoskeletal:  Positive for arthralgias, back pain and joint swelling.  All other systems reviewed and are negative.  There were no vitals taken for this visit.There is no height or weight on file to calculate BMI.  General Appearance: NA  Eye Contact:  NA  Speech:  Clear and Coherent  Volume:  Normal  Mood:  Euthymic  Affect:  NA  Thought Process:  Goal Directed  Orientation:  Full (Time, Place, and Person)  Thought Content: WDL   Suicidal Thoughts:  No  Homicidal Thoughts:  No  Memory:  Immediate;  Good Recent;   Good Remote;   Fair  Judgement:  Good  Insight:  Fair  Psychomotor Activity:  Normal  Concentration:  Concentration: Good and Attention Span: Good  Recall:  Good  Fund of Knowledge: Good  Language: Good  Akathisia:  No  Handed:  Right  AIMS (if indicated): not done  Assets:  Communication Skills Desire for Improvement Resilience Social Support Talents/Skills  ADL's:  Intact  Cognition: WNL  Sleep:  Fair   Screenings: AIMS    Flowsheet Row [facility]  AIMS Total Score 0      AUDIT    Flowsheet Row Ad[facility] ([facility]  Alcohol Use Disorder Identification Test Final Score (AUDIT) 0      PHQ2-9    Flowsheet Row [facility] Video Visit from 10/17/2020 [facility] Video Visit from 06/18/2020 [facility]  PHQ-2 Total Score 0 1 0      Flowsheet Row Video Visit from 01/15/2021 [facility] Video Visit from 10/17/2020 [facility] Video Visit from 06/18/2020 in  [facility]  C-SSRS RISK CATEGORY No Risk No Risk No Risk        Assessment and Plan: This patient is a 65 year old male with a history of depression and chronic pain.  He is doing well switching to a different pain management clinic and feels like he is getting a better response.  The Wellbutrin XL 300 mg continues to work for depression.  He was tried on a new sleep medication by primary care but this did not help and he has gone back to Benadryl.  He will return to see me in 3 months   Diannia Ruder, MD 01/15/2021, 1:21 PM

## 2021-04-17 ENCOUNTER — Encounter (HOSPITAL_COMMUNITY): Payer: Self-pay | Admitting: Psychiatry

## 2021-04-17 ENCOUNTER — Telehealth (INDEPENDENT_AMBULATORY_CARE_PROVIDER_SITE_OTHER): Payer: Medicare Other | Admitting: Psychiatry

## 2021-04-17 ENCOUNTER — Other Ambulatory Visit: Payer: Self-pay

## 2021-04-17 DIAGNOSIS — F322 Major depressive disorder, single episode, severe without psychotic features: Secondary | ICD-10-CM

## 2021-04-17 MED ORDER — BUPROPION HCL ER (XL) 300 MG PO TB24: ORAL_TABLET | ORAL | 2 refills | Status: DC

## 2021-04-17 NOTE — Progress Notes (Signed)
Virtual Visit via Telephone Note  I connected with Joe Jennings on 04/17/21 at  1:20 PM EST by telephone and verified that I am speaking with the correct person using two identifiers.  Location: Patient: home Provider: office   I discussed the limitations, risks, security and privacy concerns of performing an evaluation and management service by telephone and the availability of in person appointments. I also discussed with the patient that there may be a patient responsible charge related to this service. The patient expressed understanding and agreed to proceed.     I discussed the assessment and treatment plan with the patient. The patient was provided an opportunity to ask questions and all were answered. The patient agreed with the plan and demonstrated an understanding of the instructions.   The patient was advised to call back or seek an in-person evaluation if the symptoms worsen or if the condition fails to improve as anticipated.  I provided 15 minutes of non-face-to-face time during this encounter.   Diannia Ruder, MD  [facility] MD/PA/NP OP Progress Note  04/17/2021 1:37 PM Joe Jennings  MRN:  [id number]  Chief Complaint:  Chief Complaint  Patient presents with   Depression   Follow-up   HPI: this patient is a 66 year old married white male who lives with his wife  in [place]. They have moved from [place] about 5years ago. He has a daughter and son in [place] and one stepson who died at age 5 from side effects from psychiatric medications. 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  The patient returns for follow-up after 3 months.  He still dealing with the chronic pain in his back and shoulders.  This pain management specialist finally was able to get brand-name Roxicodone for him and it is working a lot better.  He is able to go to the gym without significant pain.  His mood is also improved.   The Wellbutrin seems to help as well.  He denies significant depression anxiety thoughts of self-harm or suicide.  He is having trouble sleeping and his pain management specialist has ordered Belsomra. Visit Diagnosis:    ICD-10-CM   1. Major depressive disorder, single episode, severe without psychotic features (HCC)  F32.2       Past Psychiatric History: 1 prior psychiatric hospitalization  Past Medical History:  Past Medical History:  Diagnosis Date   Arthritis    Chronic pain    Deep vein thrombosis (HCC) 2003   after left knee surgery   Depression    GERD (gastroesophageal reflux disease)    Headache    Neuromuscular disorder (HCC)     Past Surgical History:  Procedure Laterality Date   ABDOMINAL SURGERY     vericaseal per patient history    acromial plasty Left 1993   RESECTION DISTAL CLAVICAL  1993   ROTATOR CUFF REPAIR Right 1996   SHOULDER SURGERY Bilateral     Family Psychiatric History: see below  Family History:  Family History  Problem Relation Age of Onset   Bipolar disorder Mother    Kidney disease Mother    Bipolar disorder Sister    Bipolar disorder Sister    High blood pressure Father    Diabetes Father     Social History:  Social History   Socioeconomic History   Marital status: Married    Spouse name: Not on file   Number of children: Not on file   Years of education: Not  on file   Highest education level: Not on file  Occupational History   Not on file  Tobacco Use   Smoking status: Never   Smokeless tobacco: Never  Substance and Sexual Activity   Alcohol use: No   Drug use: No   Sexual activity: Yes  Other Topics Concern   Not on file  Social History Narrative   Not on file   Social Determinants of Health   Financial Resource Strain: Not on file  Food Insecurity: Not on file  Transportation Needs: Not on file  Physical Activity: Not on file  Stress: Not on file  Social Connections: Not on file    Allergies: No Known  Allergies  Metabolic Disorder Labs: No results found for: HGBA1C, MPG No results found for: PROLACTIN No results found for: CHOL, TRIG, HDL, CHOLHDL, VLDL, LDLCALC No results found for: TSH  Therapeutic Level Labs: No results found for: LITHIUM Lab Results  Component Value Date   VALPROATE 144.5 (H) 08/07/2014   VALPROATE 51.8 06/08/2014   No components found for:  CBMZ  Current Medications: Current Outpatient Medications  Medication Sig Dispense Refill   buPROPion (WELLBUTRIN XL) 300 MG 24 hr tablet TAKE 1 TABLET(300 MG) BY MOUTH DAILY 90 tablet 2   Celecoxib (CELEBREX PO) Take by mouth 2 (two) times daily.     diphenhydrAMINE (SOMINEX) 25 MG tablet Take by mouth.     fenofibrate (TRICOR) 145 MG tablet Take 145 mg by mouth daily.     irbesartan (AVAPRO) 150 MG tablet Take 150 mg by mouth 2 (two) times daily.     Multiple Vitamins-Minerals (MULTIVITAMIN WITH MINERALS) tablet Take 1 tablet by mouth daily. For nutritional supplementation 30 tablet    Omega-3 Fatty Acids (FISH OIL CONCENTRATE) 300 MG CAPS Take 300 mg by mouth daily.     oxycodone (ROXICODONE) 30 MG immediate release tablet Take 30 mg by mouth every 4 (four) hours.     Pantoprazole Sodium (PROTONIX PO) Take by mouth daily.     polyethylene glycol (MIRALAX / GLYCOLAX) packet Take 17 g by mouth daily.     testosterone cypionate (DEPOTESTOSTERONE CYPIONATE) 200 MG/ML injection every 7 (seven) days.      tiZANidine (ZANAFLEX) 4 MG tablet TAKE 1 TO 2 TABLETS BY MOUTH 3 TIMES DAILY     topiramate (TOPAMAX) 50 MG tablet 1 tab nightly x 7 days then 2 tabs nightly x 7 days then 3 tabs HS     Vitamin D, Ergocalciferol, (DRISDOL) 50000 units CAPS capsule Take 50,000 Units by mouth every 7 (seven) days.     No current facility-administered medications for this visit.     Musculoskeletal: Strength & Muscle Tone: na Gait & Station: na Patient leans: N/A  Psychiatric Specialty Exam: Review of Systems  Musculoskeletal:   Positive for arthralgias and neck pain.  Psychiatric/Behavioral:  Positive for sleep disturbance.   All other systems reviewed and are negative.  There were no vitals taken for this visit.There is no height or weight on file to calculate BMI.  General Appearance: NA  Eye Contact:  NA  Speech:  Clear and Coherent  Volume:  Normal  Mood:  Euthymic  Affect:  NA  Thought Process:  Goal Directed  Orientation:  Full (Time, Place, and Person)  Thought Content: NA   Suicidal Thoughts:  No  Homicidal Thoughts:  No  Memory:  Immediate;   Good Recent;   Good Remote;   Good  Judgement:  Good  Insight:  Fair  Psychomotor Activity:  Normal  Concentration:  Concentration: Good and Attention Span: Good  Recall:  Good  Fund of Knowledge: Good  Language: Good  Akathisia:  No  Handed:  Right  AIMS (if indicated): not done  Assets:  Communication Skills Desire for Improvement Resilience Social Support Talents/Skills  ADL's:  Intact  Cognition: WNL  Sleep:  Poor   Screenings: AIMS    Flowsheet Row [facility]  AIMS Total Score 0      AUDIT    Flowsheet Row [facility]  Alcohol Use Disorder Identification Test Final Score (AUDIT) 0      PHQ2-9    Flowsheet Row Video Visit from [facility]17[facility]2021 [facility] Video Visit from 01/15/2021 in [facility] Video Visit from 10/17/2020 [facility] Video Visit from 06/18/2020 [facility]  PHQ-2 Total Score 0 0 1 0      Flowsheet Row Video Visit from 04/17/2021 [facility] Video Visit from 01/15/2021 in [facility] Video Visit from  10/17/2020 [facility]  C-SSRS RISK CATEGORY No Risk No Risk No Risk        Assessment and Plan:  This patient is a 66 year old male with a history of depression and chronic pain.  He is doing better now that he feels his pain is being managed more effectively.  He will continue Wellbutrin XL 300 mg which continues to help for his depression.  He will return to see me in 63-month Collaboration of Care: Collaboration of Care: Primary Care Provider AEB chart notes will be made available to PCP at patient's request  Patient/Guardian was advised Release of Information must be obtained prior to any record release in order to collaborate their care with an outside provider. Patient/Guardian was advised if they have not already done so to contact the registration department to sign all necessary forms in order for [place] to release information regarding their care.   Consent: Patient/Guardian gives verbal consent for treatment and assignment of benefits for services provided during this visit. Patient/Guardian expressed understanding and agreed to proceed.    Diannia Ruder, MD 04/17/2021, 1:37 PM

## 2021-08-12 ENCOUNTER — Telehealth (INDEPENDENT_AMBULATORY_CARE_PROVIDER_SITE_OTHER): Payer: Medicare Other | Admitting: Psychiatry

## 2021-08-12 ENCOUNTER — Encounter (HOSPITAL_COMMUNITY): Payer: Self-pay | Admitting: Psychiatry

## 2021-08-12 DIAGNOSIS — F322 Major depressive disorder, single episode, severe without psychotic features: Secondary | ICD-10-CM | POA: Diagnosis not present

## 2021-08-12 MED ORDER — BUPROPION HCL ER (XL) 300 MG PO TB24: ORAL_TABLET | ORAL | 2 refills | Status: AC

## 2021-08-12 NOTE — Progress Notes (Signed)
Virtual Visit via Telephone Note  I connected with Joe Jennings on 08/12/21 at  1:00 PM EDT by telephone and verified that I am speaking with the correct person using two identifiers.  Location: Patient: home Provider: home office   I discussed the limitations, risks, security and privacy concerns of performing an evaluation and management service by telephone and the availability of in person appointments. I also discussed with the patient that there may be a patient responsible charge related to this service. The patient expressed understanding and agreed to proceed.      I discussed the assessment and treatment plan with the patient. The patient was provided an opportunity to ask questions and all were answered. The patient agreed with the plan and demonstrated an understanding of the instructions.   The patient was advised to call back or seek an in-person evaluation if the symptoms worsen or if the condition fails to improve as anticipated.  I provided 15 minutes of non-face-to-face time during this encounter.   Diannia Ruder, MD  [facility] MD/PA/NP OP Progress Note  08/12/2021 1:23 PM Joe Jennings  MRN:  [id number]  Chief Complaint:  Chief Complaint  Patient presents with   Depression   Follow-up   HPI: this patient is a 66 year old married white male who lives with his wife  in [place]. They have moved from [place] about 2 years ago. He has a daughter and son in [place] and one stepson who died at age 16 from side effects from psychiatric medications. 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  The patient returns for follow-up after about 4 months.  He states he is doing about the same.  He is still going to a clinic in [place] for pain management and remains on Roxicodone.  It helps his pain during the day but he still having trouble sleeping at night.  The Belsomra did not help so he stopped doing  it also caused nightmares.  He has had problems with pretty much all of the sleeping medications.  He is still going to the gym 3-4 times a week and trying to stay active but his energy level is not as good as it used to be.  Nevertheless he denies significant depression anxiety thoughts of self-harm or suicidal ideation.  He still thinks the Wellbutrin is helpful.  He wishes he could go back on Xanax to help with anxiety and sleep but his pain management specialist will let allow any benzodiazepines. Visit Diagnosis:    ICD-10-CM   1. Major depressive disorder, single episode, severe without psychotic features (HCC)  F32.2       Past Psychiatric History: 1 prior psychiatric hospitalization  Past Medical History:  Past Medical History:  Diagnosis Date   Arthritis    Chronic pain    Deep vein thrombosis (HCC) 2003   after left knee surgery   Depression    GERD (gastroesophageal reflux disease)    Headache    Neuromuscular disorder (HCC)     Past Surgical History:  Procedure Laterality Date   ABDOMINAL SURGERY     vericaseal per patient history    acromial plasty Left 1993   RESECTION DISTAL CLAVICAL  1993   ROTATOR CUFF REPAIR Right 1996   SHOULDER SURGERY Bilateral     Family Psychiatric History: see below  Family History:  Family History  Problem Relation Age of Onset   Bipolar disorder Mother    Kidney disease Mother  Bipolar disorder Sister    Bipolar disorder Sister    High blood pressure Father    Diabetes Father     Social History:  Social History   Socioeconomic History   Marital status: Married    Spouse name: Not on file   Number of children: Not on file   Years of education: Not on file   Highest education level: Not on file  Occupational History   Not on file  Tobacco Use   Smoking status: Never   Smokeless tobacco: Never  Substance and Sexual Activity   Alcohol use: No   Drug use: No   Sexual activity: Yes  Other Topics Concern   Not on  file  Social History Narrative   Not on file   Social Determinants of Health   Financial Resource Strain: Not on file  Food Insecurity: Not on file  Transportation Needs: Not on file  Physical Activity: Not on file  Stress: Not on file  Social Connections: Not on file    Allergies: No Known Allergies  Metabolic Disorder Labs: No results found for: "HGBA1C", "MPG" No results found for: "PROLACTIN" No results found for: "CHOL", "TRIG", "HDL", "CHOLHDL", "VLDL", "LDLCALC" No results found for: "TSH"  Therapeutic Level Labs: No results found for: "LITHIUM" Lab Results  Component Value Date   VALPROATE 144.5 (H) 08/07/2014   VALPROATE 51.8 06/08/2014   No results found for: "CBMZ"  Current Medications: Current Outpatient Medications  Medication Sig Dispense Refill   buPROPion (WELLBUTRIN XL) 300 MG 24 hr tablet TAKE 1 TABLET(300 MG) BY MOUTH DAILY 90 tablet 2   Celecoxib (CELEBREX PO) Take by mouth 2 (two) times daily.     diphenhydrAMINE (SOMINEX) 25 MG tablet Take by mouth.     fenofibrate (TRICOR) 145 MG tablet Take 145 mg by mouth daily.     irbesartan (AVAPRO) 150 MG tablet Take 150 mg by mouth 2 (two) times daily.     Multiple Vitamins-Minerals (MULTIVITAMIN WITH MINERALS) tablet Take 1 tablet by mouth daily. For nutritional supplementation 30 tablet    Omega-3 Fatty Acids (FISH OIL CONCENTRATE) 300 MG CAPS Take 300 mg by mouth daily.     oxycodone (ROXICODONE) 30 MG immediate release tablet Take 30 mg by mouth every 4 (four) hours.     Pantoprazole Sodium (PROTONIX PO) Take by mouth daily.     polyethylene glycol (MIRALAX / GLYCOLAX) packet Take 17 g by mouth daily.     testosterone cypionate (DEPOTESTOSTERONE CYPIONATE) 200 MG/ML injection every 7 (seven) days.      tiZANidine (ZANAFLEX) 4 MG tablet TAKE 1 TO 2 TABLETS BY MOUTH 3 TIMES DAILY     Vitamin D, Ergocalciferol, (DRISDOL) 50000 units CAPS capsule Take 50,000 Units by mouth every 7 (seven) days.     No  current facility-administered medications for this visit.     Musculoskeletal: Strength & Muscle Tone: na Gait & Station: na Patient leans: N/A  Psychiatric Specialty Exam: Review of Systems  Musculoskeletal:  Positive for arthralgias, back pain, myalgias and neck pain.  Psychiatric/Behavioral:  Positive for sleep disturbance.   All other systems reviewed and are negative.   There were no vitals taken for this visit.There is no height or weight on file to calculate BMI.  General Appearance: NA  Eye Contact:  NA  Speech:  Clear and Coherent  Volume:  Normal  Mood:  Euthymic  Affect:  NA  Thought Process:  Goal Directed  Orientation:  Full (Time, Place, and Person)  Thought Content: Rumination   Suicidal Thoughts:  No  Homicidal Thoughts:  No  Memory:  Immediate;   Good Recent;   Good Remote;   Good  Judgement:  Good  Insight:  Fair  Psychomotor Activity:  Normal  Concentration:  Concentration: Good and Attention Span: Good  Recall:  Good  Fund of Knowledge: Good  Language: Good  Akathisia:  No  Handed:  Right  AIMS (if indicated): not done  Assets:  Communication Skills Desire for Improvement Resilience Social Support Talents/Skills  ADL's:  Intact  Cognition: WNL  Sleep:  Fair   Screenings: AIMS    Flowsheet Row [facility]  AIMS Total Score 0      AUDIT    Flowsheet Row [facility] ([facility]  Alcohol Use Disorder Identification Test Final Score (AUDIT) 0      PHQ2-9    Flowsheet Row [facility] Video Visit from 04/17/2021 [facility] Video Visit from 01/15/2021 [facility] Video Visit from 10/17/2020 [facility] Video Visit from 06/18/2020 [facility]  PHQ-2 Total Score 1 0 0 1 0      Flowsheet Row Video Visit from 08/12/2021 [facility] Video Visit from 04/17/2021 [facility] Video Visit from 01/15/2021 [facility]  C-SSRS RISK CATEGORY No Risk No Risk No Risk        Assessment and Plan: This patient a 66 year old male with a history of depression and chronic pain.  His pain is better since he has switched to a new clinic.  He still not sleeping that well but he thinks it is related to pain and this needs to be addressed by pain management.  For now he will continue Wellbutrin XL 300 mg which seems to work for depression.  He will return to see me in 3 months  Collaboration of Care: Collaboration of Care: Primary Care Provider AEB notes will be shared with PCP at patient's request  Patient/Guardian was advised Release of Information must be obtained prior to any record release in order to collaborate their care with an outside provider. Patient/Guardian was advised if they have not already done so to contact the registration department to sign all necessary forms in order for [place] to release information regarding their care.   Consent: Patient/Guardian gives verbal consent for treatment and assignment of benefits for services provided during this visit. Patient/Guardian expressed understanding and agreed to proceed.    Diannia Ruder, MD 08/12/2021, 1:23 PM

## 2021-11-10 ENCOUNTER — Telehealth (HOSPITAL_COMMUNITY): Payer: Medicare Other | Admitting: Psychiatry

## 2021-11-12 ENCOUNTER — Encounter: Payer: Self-pay | Admitting: "Endocrinology

## 2021-11-12 ENCOUNTER — Ambulatory Visit (INDEPENDENT_AMBULATORY_CARE_PROVIDER_SITE_OTHER): Payer: Medicare Other | Admitting: "Endocrinology

## 2021-11-12 VITALS — BP 126/64 | HR 72 | Ht 73.0 in | Wt 206.4 lb

## 2021-11-12 DIAGNOSIS — D751 Secondary polycythemia: Secondary | ICD-10-CM | POA: Insufficient documentation

## 2021-11-12 DIAGNOSIS — E291 Testicular hypofunction: Secondary | ICD-10-CM | POA: Diagnosis not present

## 2021-11-12 MED ORDER — TESTOSTERONE CYPIONATE 200 MG/ML IM SOLN: 100.0000 mg | INTRAMUSCULAR | 0 refills | Status: DC

## 2021-11-12 MED ORDER — "SYRINGE/NEEDLE (DISP) 21G X 1-1/2"" 3 ML MISC": 2 refills | Status: AC

## 2021-11-12 NOTE — Progress Notes (Unsigned)
11/12/2021  Endocrinology Consult Note   Joe Jennings, 66 y.o., male   Chief Complaint  Patient presents with   Establish Care    Low testosterone      Past Medical History:  Diagnosis Date   Arthritis    Chronic pain    Deep vein thrombosis ([place]) 2003   after left knee surgery   Depression    GERD (gastroesophageal reflux disease)    Headache    Neuromuscular disorder ([place])    Past Surgical History:  Procedure Laterality Date   ABDOMINAL SURGERY     vericaseal per patient history    acromial plasty Left [place] Right 1996   SHOULDER SURGERY Bilateral    Social History   Socioeconomic History   Marital status: Married    Spouse name: Not on file   Number of children: Not on file   Years of education: Not on file   Highest education level: Not on file  Occupational History   Not on file  Tobacco Use   Smoking status: Never   Smokeless tobacco: Never  Vaping Use   Vaping Use: Never used  Substance and Sexual Activity   Alcohol use: No   Drug use: No   Sexual activity: Yes  Other Topics Concern   Not on file  Social History Narrative   Not on file   Social Determinants of Health   Financial Resource Strain: Not on file  Food Insecurity: Not on file  Transportation Needs: Not on file  Physical Activity: Not on file  Stress: Not on file  Social Connections: Not on file   Outpatient Encounter Medications as of 11/12/2021  Medication Sig   omeprazole (PRILOSEC) 40 MG capsule Take 1 capsule by mouth 2 (two) times daily.   SYRINGE-NEEDLE, DISP, 3 ML 21G X 1-1/2" 3 ML MISC Use to inject testosterone every week   buPROPion (WELLBUTRIN XL) 300 MG 24 hr tablet TAKE 1 TABLET(300 MG) BY MOUTH DAILY   Celecoxib (CELEBREX PO) Take by mouth 2 (two) times daily. (Patient not taking: Reported on 11/12/2021)   diphenhydrAMINE (SOMINEX) 25 MG tablet Take by mouth at  bedtime as needed.   fenofibrate (TRICOR) 145 MG tablet Take 145 mg by mouth daily. (Patient not taking: Reported on 11/12/2021)   irbesartan (AVAPRO) 150 MG tablet Take 150 mg by mouth daily.   Multiple Vitamins-Minerals (MULTIVITAMIN WITH MINERALS) tablet Take 1 tablet by mouth daily. For nutritional supplementation   Omega-3 Fatty Acids (FISH OIL CONCENTRATE) 300 MG CAPS Take 300 mg by mouth daily.   oxycodone (ROXICODONE) 30 MG immediate release tablet Take 30 mg by mouth every 4 (four) hours.   polyethylene glycol (MIRALAX / GLYCOLAX) packet Take 17 g by mouth daily.   testosterone cypionate (DEPOTESTOSTERONE CYPIONATE) 200 MG/ML injection Inject 0.5 mLs (100 mg total) into the muscle every 7 (seven) days. For a total of 400 mg a month   tiZANidine (ZANAFLEX) 4 MG tablet TAKE 1 TO 2 TABLETS BY MOUTH 3 TIMES DAILY (Patient not taking: Reported on 11/12/2021)   Vitamin D, Ergocalciferol, (DRISDOL) 50000 units CAPS capsule Take 50,000 Units by mouth every 7 (seven) days. (Patient not taking: Reported on 11/12/2021)   [DISCONTINUED] Pantoprazole Sodium (PROTONIX PO) Take by mouth daily.   [DISCONTINUED] testosterone cypionate (DEPOTESTOSTERONE CYPIONATE) 200 MG/ML injection every 7 (seven) days.    No facility-administered encounter medications on file as of 11/12/2021.   ALLERGIES: Not on  File  VACCINATION STATUS:  There is no immunization history on file for this patient.  HPI: Joe Jennings is a 66 y.o.-year-old man, being seen in consultation for evaluation and management of low testosterone requested by by his   provider  Rema Jasmine, NP.  He was found to have hypogonadism with total testostetone of *** *** ago.  He admits for decreased libido. Has difficulty obtaining or maintaining an erection. He denies  trauma to testes,  chemotherapy,  testicular irradiation,  nor genitourinary surgery. No h/o cryptorchidism. He denies history of  mumps orchitis/ history of  autoimmune  disorders.  He grew and went through puberty like his peers. No personal history of  infertility - has biological children. No incomplete/delayed sexual development.     No breast discomfort/gynecomastia. No abnormal sense of smell (only allergies). No hot flushes. No vision problems.  No report of changing headaches. No FH of hypogonadism/infertility . No Family history of hemochromatosis or pituitary tumors. No recent rapid weight change. No chronic diseases. No chronic pain. Not on opiates, does not take steroids.  No more than 2 drinks a day of alcohol at a time, and this is rarely. No anabolic steroids use. No herbal medicines. Not on antidepressants.  He does not have family  Or personal history of premature  cardiac disease.   ROS: Constitutional: no weight gain/loss, no fatigue, no subjective hyperthermia/hypothermia Eyes: no blurry vision, no xerophthalmia ENT: no sore throat, no nodules palpated in throat, no dysphagia/odynophagia, no hoarseness Cardiovascular: no CP/SOB/palpitations/leg swelling Respiratory: no cough/SOB Gastrointestinal: no N/V/D/C Musculoskeletal: no muscle/joint aches Skin: no rashes Neurological: no tremors/numbness/tingling/dizziness Psychiatric: no depression/anxiety  PE: BP 126/64   Pulse 72   Ht 6\' 1"  (1.854 m)   Wt 206 lb 6.4 oz (93.6 kg)   BMI 27.23 kg/m  Wt Readings from Last 3 Encounters:  11/12/21 206 lb 6.4 oz (93.6 kg)  09/03/17 195 lb (88.5 kg)  08/31/17 200 lb (90.7 kg)   Constitutional:  Body mass index is 27.23 kg/m.,  not in acute distress, + Normal State of Mind Eyes: PERRLA, EOMI, no exophthalmos ENT: moist mucous membranes, no thyromegaly, no cervical lymphadenopathy Cardiovascular: RRR, No MRG Respiratory: CTA B Gastrointestinal: abdomen soft, NT, ND, BS+ Musculoskeletal: no deformities, strength intact in all 4 Skin: moist, warm, no rashes Neurological: no tremor with outstretched hands, DTR normal in all 4 Genital  exam: normal male escutcheon, no inguinal LAD, normal phallus, testes ~25 mL, no testicular masses, no penile discharge.  No gynecomastia.   CMP ( most recent) CMP     Component Value Date/Time   NA 139 06/08/2014 1845   K 3.8 06/08/2014 1845   CL 104 06/08/2014 1845   CO2 24 06/08/2014 1845   GLUCOSE 105 (H) 06/08/2014 1845   BUN 14 06/08/2014 1845   CREATININE 0.90 06/08/2014 1845   CALCIUM 9.6 06/08/2014 1845   PROT 7.1 06/08/2014 1845   ALBUMIN 4.4 06/08/2014 1845   AST 20 06/08/2014 1845   ALT 30 06/08/2014 1845   ALKPHOS 62 06/08/2014 1845   BILITOT 0.5 06/08/2014 1845   GFRNONAA >90 06/08/2014 1845   GFRAA >90 06/08/2014 1845     Diabetic Labs (most recent): No results found for: "HGBA1C", "MICROALBUR"   Lipid Panel ( most recent) Lipid Panel  No results found for: "CHOL", "TRIG", "HDL", "CHOLHDL", "VLDL", "LDLCALC", "LDLDIRECT", "LABVLDL"    No results found for: "TSH", "FREET4"    ASSESSMENT: 1. Hypogonadism 2. Erectile Dysfunction   PLAN:  I have examined the patient, reviewed his labs and have had a long discussion with the patient regarding his testosterone results.   - I have discussed potential causes of hypogonadism, diagnosis of hypogonadism,  and relevant workup to confirm the diagnosis of hypogonadism before initiating testosterone replacement therapy.   -  I also discussed adverse effects of unnecessary testosterone replacement short-term and long-term.  -It is beneficial to determine etiology of hypogonadism before initiation of treatment if possible. -I have approached him for more complete laboratory work including repeat of testosterone (total and free), SHBG; FSH/LH; prolactin, ferritin in a morning sample of serum.    -If this workup indicates  Secondary etiology for hypogonadism, he will be considered for MRI imaging of sella/pituitary .  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  In the interest of avoiding adverse effects of excessive testosterone supplement, he is approached for lower dose of testosterone and he agrees.  I continued him on testosterone 100 mg IM every week for him to get 400 mg monthly-half of what he has been getting over the years.  -Given his medical history of sleep apnea and BPH which are relative contraindications for testosterone replacement, if necessary, he will be initiated with the lowest dose of testosterone and titrate based on his response.  -He will benefit the most from weight loss.  We briefly discussed about controlling his diet, improving his sleep, and exercise regularly.     2. Erectile dysfunction  - This is chronic and ongoing issue for him.  Hypogonadism likely contributing although  erection is a function of vascular health of the corpora cavernosa.  -If ED continues to be a factor in his response, he may benefit from a PDE5 inhibitor. -No prescription was initiated today.   - Time spent with the patient: 60 minutes, of which >50% was spent in obtaining information about his symptoms, reviewing his previous labs, evaluations, and treatments, counseling him about his  *** , and developing a plan to confirm the diagnosis and long term treatment as necessary.  [name] participated in the discussions, expressed understanding, and voiced agreement with the above plans.  All questions were answered to his satisfaction. he is encouraged to contact clinic should he have any questions or concerns prior to his return visit.  Return in about 3 months (around 02/11/2022) for Fasting Labs  in AM B4 8.  Marquis Lunch, MD [facility] [address] [place], [place] [address] Phone: [phone]  Fax: [phone]   11/12/2021, 7:14 PM  This note was partially dictated with voice recognition  software. Similar sounding words can be transcribed inadequately or may not  be corrected upon review.

## 2021-12-01 ENCOUNTER — Other Ambulatory Visit: Payer: Self-pay | Admitting: "Endocrinology

## 2021-12-04 ENCOUNTER — Other Ambulatory Visit: Payer: Self-pay | Admitting: "Endocrinology

## 2022-01-29 LAB — CBC WITH DIFFERENTIAL/PLATELET
Basophils Absolute: 0.2 10*3/uL (ref 0.0–0.2)
Basos: 2 %
EOS (ABSOLUTE): 0.3 10*3/uL (ref 0.0–0.4)
Eos: 3 %
Hematocrit: 55.4 % — ABNORMAL HIGH (ref 37.5–51.0)
Hemoglobin: 18.5 g/dL — ABNORMAL HIGH (ref 13.0–17.7)
Immature Grans (Abs): 0.4 10*3/uL — ABNORMAL HIGH (ref 0.0–0.1)
Immature Granulocytes: 4 %
Lymphocytes Absolute: 1.8 10*3/uL (ref 0.7–3.1)
Lymphs: 18 %
MCH: 29.8 pg (ref 26.6–33.0)
MCHC: 33.4 g/dL (ref 31.5–35.7)
MCV: 89 fL (ref 79–97)
Monocytes Absolute: 1 10*3/uL — ABNORMAL HIGH (ref 0.1–0.9)
Monocytes: 9 %
Neutrophils Absolute: 6.6 10*3/uL (ref 1.4–7.0)
Neutrophils: 64 %
Platelets: 196 10*3/uL (ref 150–450)
RBC: 6.21 x10E6/uL — ABNORMAL HIGH (ref 4.14–5.80)
RDW: 13.7 % (ref 11.6–15.4)
WBC: 10.3 10*3/uL (ref 3.4–10.8)

## 2022-01-29 LAB — PROLACTIN: Prolactin: 18.8 ng/mL — ABNORMAL HIGH (ref 4.0–15.2)

## 2022-01-29 LAB — T4, FREE: Free T4: 1.31 ng/dL (ref 0.82–1.77)

## 2022-01-29 LAB — FOLLICLE STIMULATING HORMONE: FSH: <0.3 m[IU]/mL — ABNORMAL LOW (ref 1.5–12.4)

## 2022-01-29 LAB — LUTEINIZING HORMONE: LH: <0.3 m[IU]/mL — ABNORMAL LOW (ref 1.7–8.6)

## 2022-01-29 LAB — TESTOSTERONE, FREE, TOTAL, SHBG
Sex Hormone Binding: 25.1 nmol/L (ref 19.3–76.4)
Testosterone, Free: 20.9 pg/mL — ABNORMAL HIGH (ref 6.6–18.1)
Testosterone: 866 ng/dL (ref 264–916)

## 2022-01-29 LAB — TSH: TSH: 2.87 u[IU]/mL (ref 0.450–4.500)

## 2022-01-29 LAB — CORTISOL-AM, BLOOD: Cortisol - AM: 8.9 ug/dL (ref 6.2–19.4)

## 2022-02-05 ENCOUNTER — Ambulatory Visit (INDEPENDENT_AMBULATORY_CARE_PROVIDER_SITE_OTHER): Payer: Medicare Other | Admitting: "Endocrinology

## 2022-02-05 ENCOUNTER — Encounter: Payer: Self-pay | Admitting: "Endocrinology

## 2022-02-05 VITALS — BP 140/62 | HR 80 | Ht 73.0 in | Wt 216.2 lb

## 2022-02-05 DIAGNOSIS — E291 Testicular hypofunction: Secondary | ICD-10-CM | POA: Diagnosis not present

## 2022-02-05 DIAGNOSIS — E221 Hyperprolactinemia: Secondary | ICD-10-CM | POA: Insufficient documentation

## 2022-02-05 DIAGNOSIS — E349 Endocrine disorder, unspecified: Secondary | ICD-10-CM

## 2022-02-05 DIAGNOSIS — D751 Secondary polycythemia: Secondary | ICD-10-CM | POA: Diagnosis not present

## 2022-02-05 MED ORDER — TESTOSTERONE CYPIONATE 200 MG/ML IM SOLN: 100.0000 mg | INTRAMUSCULAR | 0 refills | Status: AC

## 2022-02-05 NOTE — Progress Notes (Signed)
02/05/2022   Endocrinology follow-up note   Joe Jennings, 66 y.o., male   Chief Complaint  Patient presents with   Follow-up    Hypogonadism, male     Past Medical History:  Diagnosis Date   Arthritis    Chronic pain    Deep vein thrombosis (HCC) 2003   after left knee surgery   Depression    GERD (gastroesophageal reflux disease)    Headache    Neuromuscular disorder (HCC)    Past Surgical History:  Procedure Laterality Date   ABDOMINAL SURGERY     vericaseal per patient history    acromial plasty Left 1993   RESECTION DISTAL CLAVICAL  1993   ROTATOR CUFF REPAIR Right 1996   SHOULDER SURGERY Bilateral    Social History   Socioeconomic History   Marital status: Married    Spouse name: Not on file   Number of children: Not on file   Years of education: Not on file   Highest education level: Not on file  Occupational History   Not on file  Tobacco Use   Smoking status: Never   Smokeless tobacco: Never  Vaping Use   Vaping Use: Never used  Substance and Sexual Activity   Alcohol use: No   Drug use: No   Sexual activity: Yes  Other Topics Concern   Not on file  Social History Narrative   Not on file   Social Determinants of Health   Financial Resource Strain: Not on file  Food Insecurity: Not on file  Transportation Needs: Not on file  Physical Activity: Not on file  Stress: Not on file  Social Connections: Not on file   Outpatient Encounter Medications as of 02/05/2022  Medication Sig   tiZANidine (ZANAFLEX) 4 MG tablet    Vitamin D, Ergocalciferol, (DRISDOL) 50000 units CAPS capsule Take 50,000 Units by mouth every 7 (seven) days.   buPROPion (WELLBUTRIN XL) 300 MG 24 hr tablet TAKE 1 TABLET(300 MG) BY MOUTH DAILY   diphenhydrAMINE (SOMINEX) 25 MG tablet Take by mouth at bedtime as needed.   irbesartan (AVAPRO) 150 MG tablet Take 150 mg by mouth daily.   Multiple Vitamins-Minerals  (MULTIVITAMIN WITH MINERALS) tablet Take 1 tablet by mouth daily. For nutritional supplementation   Omega-3 Fatty Acids (FISH OIL CONCENTRATE) 300 MG CAPS Take 300 mg by mouth daily.   omeprazole (PRILOSEC) 40 MG capsule Take 1 capsule by mouth 2 (two) times daily.   oxycodone (ROXICODONE) 30 MG immediate release tablet Take 30 mg by mouth every 4 (four) hours.   polyethylene glycol (MIRALAX / GLYCOLAX) packet Take 17 g by mouth daily.   SYRINGE-NEEDLE, DISP, 3 ML 21G X 1-1/2" 3 ML MISC Use to inject testosterone every week   testosterone cypionate (DEPOTESTOSTERONE CYPIONATE) 200 MG/ML injection Inject 0.5 mLs (100 mg total) into the muscle every 14 (fourteen) days.   [DISCONTINUED] Celecoxib (CELEBREX PO) Take by mouth 2 (two) times daily. (Patient not taking: Reported on 11/12/2021)   [DISCONTINUED] fenofibrate (TRICOR) 145 MG tablet Take 145 mg by mouth daily. (Patient not taking: Reported on 11/12/2021)   [DISCONTINUED] testosterone cypionate (DEPOTESTOSTERONE CYPIONATE) 200 MG/ML injection INJECT 0.5 ML( 100 MG TOTAL) IN THE MUSCLE EVERY 7 DAYS FOR A TOTAL OF 400 MG A MONTH   No facility-administered encounter medications on file as of 02/05/2022.   ALLERGIES: Allergies  Allergen Reactions   Doxepin    Hydroxacen [Hydroxyzine]     VACCINATION STATUS:  There is no immunization history on file  for this patient.  HPI: Joe Jennings is a 66 y.o.-year-old man, being seen in follow-up after he was seen in consultation for evaluation and management of low testosterone requested by by his   provider  Rema Jasmine, NP.  See notes from last visit.  History is obtained directly from the patient as well as chart review. He was found to have hypogonadism with hypogonadism at approximate age of 71 years.  He has taken various dose of testosterone over the years.  His testosterone was lowered from 800 mg nightly to 400 mg daily during his last visit due to evidence of hyper replacement and  erythrocytosis.   He presents with improved testosterone profile still at 866, hematocrit at 55.4, hemoglobin at 18.5.  He has history of varicocele for which he underwent surgery several years ago.  He also had attacks of epididymitis and prostatitis.  He denies any chemotherapy , radiation, nor any history of direct testicular injury.  He admits to decreased libido.  He has 4 biological children.   He reports fatigue whenever his testosterone dose is lowered. No h/o cryptorchidism. He denies history of  mumps orchitis/ history of  autoimmune disorders.  He grew and went through puberty like his peers. No personal history of  infertility - has biological children. No incomplete/delayed sexual development.  He has history of chronic pain for which he had to use opioids for the last 10+ years.    No breast discomfort/gynecomastia. No abnormal sense of smell . No report of changing headaches. No Family history of hemochromatosis or pituitary tumors. No recent rapid weight change. No chronic diseases. He denies any history of use of anabolic steroids or androgens.   Other medical problems include mood disorder for which he is on Wellbutrin.  Currently on oral oxycodone 30 mg 6 times a day for pain syndrome.  He also has hypertension on treatment with Avapro 150 mg p.o. daily.  ROS: Constitutional: + Minimally fluctuating body weight, + fatigue, no subjective hyperthermia/hypothermia Eyes: no blurry vision, no xerophthalmia ENT: no sore throat, no nodules palpated in throat, no dysphagia/odynophagia, no hoarseness Cardiovascular: no CP/SOB/palpitations/leg swelling Respiratory: no cough/SOB Gastrointestinal: no N/V/D/C Musculoskeletal: no muscle/joint aches Skin: no rashes Neurological: no tremors/numbness/tingling/dizziness Psychiatric: no depression/anxiety  PE: BP (!) 140/62   Pulse 80   Ht 6\' 1"  (1.854 m)   Wt 216 lb 3.2 oz (98.1 kg)   BMI 28.52 kg/m  Wt Readings from Last 3  Encounters:  02/05/22 216 lb 3.2 oz (98.1 kg)  11/12/21 206 lb 6.4 oz (93.6 kg)  09/03/17 195 lb (88.5 kg)   Constitutional:  Body mass index is 28.52 kg/m.,  not in acute distress, + Normal State of Mind Eyes: PERRLA, EOMI, no exophthalmos ENT: moist mucous membranes, no thyromegaly, no cervical lymphadenopathy  Genital exam: + Soft and frank testicles bilaterally 12 cc on the right, 15 cc on the left.   no inguinal LAD, normal phallus,  no testicular masses, no penile discharge.  No gynecomastia.   CMP ( most recent) CMP     Component Value Date/Time   NA 139 06/08/2014 1845   K 3.8 06/08/2014 1845   CL 104 06/08/2014 1845   CO2 24 06/08/2014 1845   GLUCOSE 105 (H) 06/08/2014 1845   BUN 14 06/08/2014 1845   CREATININE 0.90 06/08/2014 1845   CALCIUM 9.6 06/08/2014 1845   PROT 7.1 06/08/2014 1845   ALBUMIN 4.4 06/08/2014 1845   AST 20 06/08/2014 1845   ALT 30  06/08/2014 1845   ALKPHOS 62 06/08/2014 1845   BILITOT 0.5 06/08/2014 1845   GFRNONAA >90 06/08/2014 1845   GFRAA >90 06/08/2014 1845   Recent Results (from the past 2160 hour(s))  Testosterone, Free, Total, SHBG     Status: Abnormal   Collection Time: 01/27/22 10:38 AM  Result Value Ref Range   Testosterone 866 264 - 916 ng/dL    Comment: Adult male reference interval is based on a population of healthy nonobese males (BMI <30) between 719 and 631 years old. Travison, et.al. JCEM [id number],102;1161-1173. PMID: [id number].    Testosterone, Free 20.9 (H) 6.6 - 18.1 pg/mL   Sex Hormone Binding 25.1 19.3 - 76.4 nmol/L  Prolactin     Status: Abnormal   Collection Time: 01/27/22 10:38 AM  Result Value Ref Range   Prolactin 18.8 (H) 4.0 - 15.2 ng/mL    Comment: **Effective February 09, 2022 Prolactin reference**   interval will be changing to:              Age           Male (ng/mL)  Male (ng/mL)           0 - 30 days       4.3 - 32.8     4.5 - 24.4           1 -  6 mos        5.4 - 51.4     5.4 - 51.4          4318m -  1  yrs        5.3 - 28.9     4.8 - 33.4           2 - 12 yrs        1.8 - 44.2     4.8 - 33.4          13 - 30 yrs        3.6 - 31.5     4.8 - 33.4          31 - 50 yrs        3.9 - 22.7     4.8 - 33.4          51 - 80 yrs        3.6 - 25.2     3.6 - 25.2              >80 yrs        3.6 - 32.0     3.6 - 32.0   Luteinizing hormone     Status: Abnormal   Collection Time: 01/27/22 10:38 AM  Result Value Ref Range   LH <0.3 (L) 1.7 - 8.6 mIU/mL  Follicle stimulating hormone     Status: Abnormal   Collection Time: 01/27/22 10:38 AM  Result Value Ref Range   FSH <0.3 (L) 1.5 - 12.4 mIU/mL  CBC with Differential/Platelet     Status: Abnormal   Collection Time: 01/27/22 10:38 AM  Result Value Ref Range   WBC 10.3 3.4 - 10.8 x10E3/uL   RBC 6.21 (H) 4.14 - 5.80 x10E6/uL   Hemoglobin 18.5 (H) 13.0 - 17.7 g/dL   Hematocrit 18.855.4 (H) 41.637.5 - 51.0 %   MCV 89 79 - 97 fL   MCH 29.8 26.6 - 33.0 pg   MCHC 33.4 31.5 - 35.7 g/dL   RDW 60.613.7 30.111.6 - 60.115.4 %   Platelets 196 150 - 450 x10E3/uL  Neutrophils 64 Not Estab. %   Lymphs 18 Not Estab. %   Monocytes 9 Not Estab. %   Eos 3 Not Estab. %   Basos 2 Not Estab. %   Neutrophils Absolute 6.6 1.4 - 7.0 x10E3/uL   Lymphocytes Absolute 1.8 0.7 - 3.1 x10E3/uL   Monocytes Absolute 1.0 (H) 0.1 - 0.9 x10E3/uL   EOS (ABSOLUTE) 0.3 0.0 - 0.4 x10E3/uL   Basophils Absolute 0.2 0.0 - 0.2 x10E3/uL   Immature Granulocytes 4 Not Estab. %   Immature Grans (Abs) 0.4 (H) 0.0 - 0.1 x10E3/uL    Comment: (An elevated percentage of Immature Granulocytes has not been found to be clinically significant as a sole clinical predictor of disease. Does NOT include bands or blast cells.  Pregnancy associated physiological leukocytosis may also show increased immature granulocytes without clinical significance.)   Cortisol-am, blood     Status: None   Collection Time: 01/27/22 10:38 AM  Result Value Ref Range   Cortisol - AM 8.9 6.2 - 19.4 ug/dL  TSH     Status: None    Collection Time: 01/27/22 10:38 AM  Result Value Ref Range   TSH 2.870 0.450 - 4.500 uIU/mL  T4, free     Status: None   Collection Time: 01/27/22 10:38 AM  Result Value Ref Range   Free T4 1.31 0.82 - 1.77 ng/dL    June 09, 2021 labs: Total testosterone 495, hemoglobin 16.4, hematocrit 50.3 hemoglobin A1c 5.5%, AST 50, ALT 36  ASSESSMENT: 1. Hypogonadism 2.  Erythrocytosis  PLAN:  I have reviewed his recent labs with him and had a long discussion about safe use of testosterone.    - I have discussed potential causes of hypogonadism, diagnosis of hypogonadism,  and relevant workup to confirm the diagnosis of hypogonadism . His initial work-up is not available to review.  -  I also discussed adverse effects of unnecessary testosterone replacement short-term and long-term.  -It would have been beneficial to determine etiology of hypogonadism before initiation of treatment if possible. Clearly, he is getting too much testosterone.  I had a long discussion with him and he hesitantly accepts to lower the dose. In the interest of avoiding adverse effects of excessive testosterone supplement, he is approached for lower dose of testosterone further and he accepts.  I discussed and lowered his testosterone to 100 mg IM every other week to give him 200 mg monthly total in light of the fact that he has total testosterone of 866 above his age-appropriate limits.   I advised him to plan with repeat his labs in 3 to 4 months.  Target level testosterone for him will be 250-400.  Given his erythrocytosis, his high risk candidate to give testosterone to especially at higher doses. His labs also show suppressed gonadotropins, prolactin age-appropriate.   Etiology of his hypogonadism is likely secondary to opioid suppression of hypothalamic/pituitary-gonadal axis.  He will not need brain imaging at this time.  Secondary hypogonadism may also come with possibility of adrenal insufficiency.  His a.m.  cortisol was suboptimal at 8.9, will need continued follow-up.    - In this particular patient with bilaterally shrunk testes is likely that he  has had chronic hypogonadism.  He is not interested to keep his fertility at this time.    His insurance did not cover PSA, this needs to be part of his next annual physical.    -Given his medical history of sleep apnea and BPH which are relative contraindications for testosterone  replacement, if necessary, he will be kept towards the lower end of target testosterone for his age.     -He will benefit the most from weight loss.  We briefly discussed about controlling his diet, improving his sleep, and exercise regularly.   He is advised to maintain close follow-up with his PCP, and other specialists as necessary.   I spent 26 minutes in the care of the patient today including review of labs from Thyroid Function, CMP, and other relevant labs ; imaging/biopsy records (current and previous including abstractions from other facilities); face-to-face time discussing  his lab results and symptoms, medications doses, his options of short and long term treatment based on the latest standards of care / guidelines;   and documenting the encounter.  Joe Jennings  participated in the discussions, expressed understanding, and voiced agreement with the above plans.  All questions were answered to his satisfaction. he is encouraged to contact clinic should he have any questions or concerns prior to his return visit.   Return for Fasting Labs  in AM B4 8.  Marquis Lunch, MD [facility] [address] [place], [place] [address] Phone: [phone]  Fax: [phone]   02/05/2022, 7:28 PM  This note was partially dictated with voice recognition software. Similar sounding words can be transcribed inadequately or may not  be corrected upon review.

## 2022-02-11 ENCOUNTER — Ambulatory Visit: Payer: Medicare Other | Admitting: "Endocrinology

## 2022-04-27 LAB — CBC WITH DIFFERENTIAL/PLATELET
Basophils Absolute: 0.1 10*3/uL (ref 0.0–0.2)
Basos: 1 %
EOS (ABSOLUTE): 0.2 10*3/uL (ref 0.0–0.4)
Eos: 2 %
Hematocrit: 54.1 % — ABNORMAL HIGH (ref 37.5–51.0)
Hemoglobin: 18 g/dL — ABNORMAL HIGH (ref 13.0–17.7)
Immature Grans (Abs): 0.1 10*3/uL (ref 0.0–0.1)
Immature Granulocytes: 1 %
Lymphocytes Absolute: 1.7 10*3/uL (ref 0.7–3.1)
Lymphs: 20 %
MCH: 29.8 pg (ref 26.6–33.0)
MCHC: 33.3 g/dL (ref 31.5–35.7)
MCV: 90 fL (ref 79–97)
Monocytes Absolute: 1 10*3/uL — ABNORMAL HIGH (ref 0.1–0.9)
Monocytes: 12 %
Neutrophils Absolute: 5.3 10*3/uL (ref 1.4–7.0)
Neutrophils: 64 %
Platelets: 208 10*3/uL (ref 150–450)
RBC: 6.04 x10E6/uL — ABNORMAL HIGH (ref 4.14–5.80)
RDW: 13 % (ref 11.6–15.4)
WBC: 8.3 10*3/uL (ref 3.4–10.8)

## 2022-04-27 LAB — TESTOSTERONE, FREE, TOTAL, SHBG
Sex Hormone Binding: 24.9 nmol/L (ref 19.3–76.4)
Testosterone, Free: 20.9 pg/mL — ABNORMAL HIGH (ref 6.6–18.1)
Testosterone: 733 ng/dL (ref 264–916)

## 2022-04-27 LAB — PROLACTIN: Prolactin: 16.3 ng/mL (ref 3.6–25.2)

## 2022-05-07 ENCOUNTER — Ambulatory Visit: Payer: Medicare Other | Admitting: "Endocrinology
